# Patient Record
Sex: Female | Born: 1960 | Race: White | Hispanic: No | Marital: Married | Smoking: Never smoker
Health system: Southern US, Community
[De-identification: ages and names within clinical notes are randomized; demographics above are authoritative.]

## PROBLEM LIST (undated history)

## (undated) DIAGNOSIS — E039 Hypothyroidism, unspecified: Secondary | ICD-10-CM

## (undated) DIAGNOSIS — K8020 Calculus of gallbladder without cholecystitis without obstruction: Secondary | ICD-10-CM

## (undated) DIAGNOSIS — K760 Fatty (change of) liver, not elsewhere classified: Secondary | ICD-10-CM

## (undated) DIAGNOSIS — R112 Nausea with vomiting, unspecified: Secondary | ICD-10-CM

## (undated) DIAGNOSIS — K824 Cholesterolosis of gallbladder: Secondary | ICD-10-CM

## (undated) DIAGNOSIS — Z9889 Other specified postprocedural states: Secondary | ICD-10-CM

## (undated) DIAGNOSIS — E785 Hyperlipidemia, unspecified: Secondary | ICD-10-CM

## (undated) HISTORY — DX: Hyperlipidemia, unspecified: E78.5

---

## 1990-01-23 HISTORY — PX: DIAGNOSTIC LAPAROSCOPY: SUR761

## 2007-01-24 HISTORY — PX: ABDOMINAL HYSTERECTOMY: SHX81

## 2011-01-24 HISTORY — PX: COLONOSCOPY: SHX174

## 2017-07-11 ENCOUNTER — Other Ambulatory Visit: Payer: Self-pay | Admitting: Internal Medicine

## 2017-07-11 DIAGNOSIS — Z8719 Personal history of other diseases of the digestive system: Secondary | ICD-10-CM

## 2017-07-31 ENCOUNTER — Other Ambulatory Visit: Payer: Self-pay | Admitting: Gastroenterology

## 2017-07-31 ENCOUNTER — Ambulatory Visit (HOSPITAL_COMMUNITY)
Admission: RE | Admit: 2017-07-31 | Discharge: 2017-07-31 | Disposition: A | Discharge status: Home / Self Care | Payer: 59 | Source: Ambulatory Visit | Attending: Gastroenterology | Admitting: Gastroenterology

## 2017-07-31 DIAGNOSIS — R1011 Right upper quadrant pain: Secondary | ICD-10-CM | POA: Diagnosis not present

## 2017-08-03 ENCOUNTER — Other Ambulatory Visit: Payer: Self-pay | Admitting: Surgery

## 2017-08-13 ENCOUNTER — Encounter (HOSPITAL_COMMUNITY): Payer: Self-pay

## 2017-08-13 NOTE — Patient Instructions (Signed)
Your procedure is scheduled on: Thursday, August 16, 2017   Surgery Time:  9:00AM-10:00AM   [facility]    Report to admitting at 7:00 AM   Call this number if you have problems the morning of surgery [phone]   Do not eat food or drink liquids :After Midnight.   Do NOT smoke after Midnight   Complete one Ensure drink the morning of surgery by 6:00AM the day of surgery.   Take these medicines the morning of surgery with A SIP OF WATER: Thyroid                               You may not have any metal on your body including hair pins, jewelry, and body piercings             Do not wear make-up, lotions, powders, perfumes/cologne, or deodorant             Do not wear nail polish.  Do not shave  48 hours prior to surgery.            Do not bring valuables to the hospital. [facility] IS NOT             RESPONSIBLE   FOR VALUABLES.   Contacts, dentures or bridgework may not be worn into surgery.    Patients discharged the day of surgery will not be allowed to drive home.   Special Instructions: Bring a copy of your healthcare power of attorney and living will documents         the day of surgery if you haven't scanned them in before.              Please read over the following fact sheets you were given:  [facility] - Preparing for [facility] Before surgery, you can play an important role.  Because skin is not sterile, your skin needs to be as free of germs as possible.  You can reduce the number of germs on your skin by washing with CHG (chlorahexidine gluconate) soap before surgery.  CHG is an antiseptic cleaner which kills germs and bonds with the skin to continue killing germs even after washing. Please DO NOT use if you have an allergy to CHG or antibacterial soaps.  If your skin becomes reddened/irritated stop using the CHG and inform your nurse when you arrive at Short Stay. Do not shave (including legs and underarms) for at least 48 hours  prior to the first CHG shower.  You may shave your face/neck.  Please follow these instructions carefully:  1.  Shower with CHG Soap the night before surgery and the  morning of surgery.  2.  If you choose to wash your hair, wash your hair first as usual with your normal  shampoo.  3.  After you shampoo, rinse your hair and body thoroughly to remove the shampoo.                             4.  Use CHG as you would any other liquid soap.  You can apply chg directly to the skin and wash.  Gently with a scrungie or clean washcloth.  5.  Apply the CHG Soap to your body ONLY FROM THE NECK DOWN.   Do   not use on face/ open  Wound or open sores. Avoid contact with eyes, ears mouth and   genitals (private parts).                       Wash face,  Genitals (private parts) with your normal soap.             6.  Wash thoroughly, paying special attention to the area where your    surgery  will be performed.  7.  Thoroughly rinse your body with warm water from the neck down.  8.  DO NOT shower/wash with your normal soap after using and rinsing off the CHG Soap.                9.  Pat yourself dry with a clean towel.            10.  Wear clean pajamas.            11.  Place clean sheets on your bed the night of your first shower and do not  sleep with pets. Day of Surgery : Do not apply any lotions/deodorants the morning of surgery.  Please wear clean clothes to the hospital/surgery center.  FAILURE TO FOLLOW THESE INSTRUCTIONS MAY RESULT IN THE CANCELLATION OF YOUR SURGERY  PATIENT SIGNATURE_________________________________  NURSE SIGNATURE__________________________________  ________________________________________________________________________

## 2017-08-14 ENCOUNTER — Encounter (HOSPITAL_COMMUNITY)
Admission: RE | Admit: 2017-08-14 | Discharge: 2017-08-14 | Disposition: A | Discharge status: Home / Self Care | Payer: 59 | Source: Ambulatory Visit | Attending: Surgery | Admitting: Surgery

## 2017-08-14 ENCOUNTER — Other Ambulatory Visit: Payer: Self-pay

## 2017-08-14 ENCOUNTER — Encounter (HOSPITAL_COMMUNITY): Payer: Self-pay

## 2017-08-14 DIAGNOSIS — Z01812 Encounter for preprocedural laboratory examination: Secondary | ICD-10-CM | POA: Diagnosis not present

## 2017-08-14 DIAGNOSIS — K8010 Calculus of gallbladder with chronic cholecystitis without obstruction: Secondary | ICD-10-CM | POA: Diagnosis not present

## 2017-08-14 DIAGNOSIS — E039 Hypothyroidism, unspecified: Secondary | ICD-10-CM | POA: Diagnosis not present

## 2017-08-14 DIAGNOSIS — K8020 Calculus of gallbladder without cholecystitis without obstruction: Secondary | ICD-10-CM | POA: Diagnosis present

## 2017-08-14 DIAGNOSIS — Z79899 Other long term (current) drug therapy: Secondary | ICD-10-CM | POA: Diagnosis not present

## 2017-08-14 HISTORY — DX: Fatty (change of) liver, not elsewhere classified: K76.0

## 2017-08-14 HISTORY — DX: Hypothyroidism, unspecified: E03.9

## 2017-08-14 HISTORY — DX: Nausea with vomiting, unspecified: R11.2

## 2017-08-14 HISTORY — DX: Calculus of gallbladder without cholecystitis without obstruction: K80.20

## 2017-08-14 HISTORY — DX: Cholesterolosis of gallbladder: K82.4

## 2017-08-14 HISTORY — DX: Other specified postprocedural states: Z98.890

## 2017-08-14 LAB — CBC
HCT: 43.3 % (ref 36.0–46.0)
HEMOGLOBIN: 14.7 g/dL (ref 12.0–15.0)
MCH: 31.4 pg (ref 26.0–34.0)
MCHC: 33.9 g/dL (ref 30.0–36.0)
MCV: 92.5 fL (ref 78.0–100.0)
Platelets: 355 10*3/uL (ref 150–400)
RBC: 4.68 MIL/uL (ref 3.87–5.11)
RDW: 12.8 % (ref 11.5–15.5)
WBC: 8.1 10*3/uL (ref 4.0–10.5)

## 2017-08-15 NOTE — H&P (Signed)
   Gaspar GarbeRosemary E Henderson Documented: 08/03/2017 8:52 AM Location: [facility] Patient #: [id number] DOB: [birth year] Married / Language: English / Race: White Female   History of Present Illness (Pape Parson A. Magnus IvanBlackman MD; 08/03/2017 9:01 AM) The patient is a 57 year old female who presents for evaluation of gall stones. This patient was referred by Dr. Charna ElizabethJyothi Mann for evaluation of cholelithiasis. 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 She has been having some intermittent back pain but then this past Sunday developed epigastric and right upper quadrant abdominal pain with a burning sensation. There is no nausea or vomiting. She underwent an ultrasound showing cholelithiasis with gallbladder polyps. There was no evidence of cholecystitis but she reports she had a positive Murphy sign. She denies jaundice. She is otherwise without complaints.   Allergies Renee Ramus(Armen Ferguson, CMA; 08/03/2017 8:53 AM) Tegaderm *MEDICAL DEVICES AND SUPPLIES*  Tape  Medication History (Armen Ferguson, CMA; 08/03/2017 8:53 AM) Amphetamine-Dextroamphetamine (5MG  Tablet, Oral) Active. Amoxicillin-Pot Clavulanate (875-125MG  Tablet, Oral) Active. Armour Thyroid (30MG  Tablet, Oral) Active. Hydrocortisone Valerate (0.2% Cream, External) Active. Medications Reconciled  Vitals (Armen Ferguson CMA; 08/03/2017 8:52 AM) 08/03/2017 8:52 AM Weight: 149.5 lb Height: 63in Body Surface Area: 1.71 m Body Mass Index: 26.48 kg/m  Temp.: 98.78F  Pulse: 93 (Regular)  P.OX: 100% (Room air) BP: 124/82 (Sitting, Left Arm, Standard)       Physical Exam (Kairah Leoni A. Magnus IvanBlackman MD; 08/03/2017 9:01 AM) The physical exam findings are as follows: Note:She appears fairly comfortable today Lungs are clear bilaterally Cardiovascular is regular rate and rhythm Abdomen is soft. It is nondistended. There is mild tenderness with guarding in the right upper  quadrant    Assessment & Plan (Lisel Siegrist A. Magnus IvanBlackman MD; 08/03/2017 9:02 AM) SYMPTOMATIC CHOLELITHIASIS (K80.20) Impression: We discussed the diagnosis in detail. Again, she knows well about gallbladder surgery and wishes to proceed with a laparoscopic cholecystectomy as soon as possible. I discussed the procedure with her in detail. We discussed the risks of the procedures well. Surgery will be scheduled as soon as possible.

## 2017-08-16 ENCOUNTER — Other Ambulatory Visit: Payer: Self-pay

## 2017-08-16 ENCOUNTER — Ambulatory Visit (HOSPITAL_COMMUNITY)
Admission: RE | Admit: 2017-08-16 | Discharge: 2017-08-16 | Disposition: A | Discharge status: Home / Self Care | Payer: 59 | Source: Ambulatory Visit | Attending: Surgery | Admitting: Surgery

## 2017-08-16 ENCOUNTER — Encounter (HOSPITAL_COMMUNITY): Payer: Self-pay | Admitting: *Deleted

## 2017-08-16 ENCOUNTER — Encounter (HOSPITAL_COMMUNITY)
Admission: RE | Disposition: A | Discharge status: Home / Self Care | Payer: Self-pay | Source: Ambulatory Visit | Attending: Surgery

## 2017-08-16 ENCOUNTER — Ambulatory Visit (HOSPITAL_COMMUNITY): Payer: 59 | Admitting: Anesthesiology

## 2017-08-16 DIAGNOSIS — K8010 Calculus of gallbladder with chronic cholecystitis without obstruction: Secondary | ICD-10-CM | POA: Insufficient documentation

## 2017-08-16 DIAGNOSIS — Z79899 Other long term (current) drug therapy: Secondary | ICD-10-CM | POA: Insufficient documentation

## 2017-08-16 DIAGNOSIS — Z01812 Encounter for preprocedural laboratory examination: Secondary | ICD-10-CM | POA: Insufficient documentation

## 2017-08-16 DIAGNOSIS — E039 Hypothyroidism, unspecified: Secondary | ICD-10-CM | POA: Insufficient documentation

## 2017-08-16 HISTORY — PX: CHOLECYSTECTOMY: SHX55

## 2017-08-16 SURGERY — LAPAROSCOPIC CHOLECYSTECTOMY
Anesthesia: General | Site: Abdomen

## 2017-08-16 MED ORDER — KETOROLAC TROMETHAMINE 30 MG/ML IJ SOLN
INTRAMUSCULAR | Status: AC
  Filled 2017-08-16: qty 1

## 2017-08-16 MED ORDER — BUPIVACAINE HCL (PF) 0.5 % IJ SOLN
INTRAMUSCULAR | Status: DC | PRN
  Administered 2017-08-16: 20 mL

## 2017-08-16 MED ORDER — PROPOFOL 10 MG/ML IV BOLUS
INTRAVENOUS | Status: AC
  Filled 2017-08-16: qty 20

## 2017-08-16 MED ORDER — DEXAMETHASONE SODIUM PHOSPHATE 10 MG/ML IJ SOLN
INTRAMUSCULAR | Status: AC
  Filled 2017-08-16: qty 1

## 2017-08-16 MED ORDER — FENTANYL CITRATE (PF) 100 MCG/2ML IJ SOLN: 25.0000 ug | INTRAMUSCULAR | Status: DC | PRN

## 2017-08-16 MED ORDER — SCOPOLAMINE 1 MG/3DAYS TD PT72
MEDICATED_PATCH | TRANSDERMAL | Status: DC | PRN
  Administered 2017-08-16: 1 via TRANSDERMAL

## 2017-08-16 MED ORDER — GABAPENTIN 300 MG PO CAPS: 300.0000 mg | ORAL_CAPSULE | ORAL | Status: DC

## 2017-08-16 MED ORDER — ONDANSETRON HCL 4 MG/2ML IJ SOLN: 4.0000 mg | Freq: Four times a day (QID) | INTRAMUSCULAR | Status: DC | PRN

## 2017-08-16 MED ORDER — SCOPOLAMINE 1 MG/3DAYS TD PT72
MEDICATED_PATCH | TRANSDERMAL | Status: AC
  Filled 2017-08-16: qty 1

## 2017-08-16 MED ORDER — SUGAMMADEX SODIUM 200 MG/2ML IV SOLN
INTRAVENOUS | Status: AC
  Filled 2017-08-16: qty 2

## 2017-08-16 MED ORDER — BUPIVACAINE HCL (PF) 0.5 % IJ SOLN
INTRAMUSCULAR | Status: AC
  Filled 2017-08-16: qty 30

## 2017-08-16 MED ORDER — CEFAZOLIN SODIUM-DEXTROSE 2-4 GM/100ML-% IV SOLN
2.0000 g | INTRAVENOUS | Status: AC
  Administered 2017-08-16: 2 g via INTRAVENOUS
  Filled 2017-08-16: qty 100

## 2017-08-16 MED ORDER — ACETAMINOPHEN 10 MG/ML IV SOLN
INTRAVENOUS | Status: DC | PRN
  Administered 2017-08-16: 1000 mg via INTRAVENOUS

## 2017-08-16 MED ORDER — 0.9 % SODIUM CHLORIDE (POUR BTL) OPTIME
TOPICAL | Status: DC | PRN
  Administered 2017-08-16: 1000 mL

## 2017-08-16 MED ORDER — LIDOCAINE 2% (20 MG/ML) 5 ML SYRINGE
INTRAMUSCULAR | Status: DC | PRN
  Administered 2017-08-16: 100 mg via INTRAVENOUS

## 2017-08-16 MED ORDER — PROPOFOL 10 MG/ML IV BOLUS
INTRAVENOUS | Status: DC | PRN
  Administered 2017-08-16: 160 mg via INTRAVENOUS

## 2017-08-16 MED ORDER — LACTATED RINGERS IV SOLN
INTRAVENOUS | Status: DC
  Administered 2017-08-16: 08:00:00 via INTRAVENOUS

## 2017-08-16 MED ORDER — ROCURONIUM BROMIDE 10 MG/ML (PF) SYRINGE
PREFILLED_SYRINGE | INTRAVENOUS | Status: AC
  Filled 2017-08-16: qty 10

## 2017-08-16 MED ORDER — KETOROLAC TROMETHAMINE 30 MG/ML IJ SOLN
INTRAMUSCULAR | Status: DC | PRN
  Administered 2017-08-16: 30 mg via INTRAVENOUS

## 2017-08-16 MED ORDER — ONDANSETRON HCL 4 MG/2ML IJ SOLN
INTRAMUSCULAR | Status: AC
  Filled 2017-08-16: qty 2

## 2017-08-16 MED ORDER — DEXAMETHASONE SODIUM PHOSPHATE 10 MG/ML IJ SOLN
INTRAMUSCULAR | Status: DC | PRN
  Administered 2017-08-16: 10 mg via INTRAVENOUS

## 2017-08-16 MED ORDER — FENTANYL CITRATE (PF) 250 MCG/5ML IJ SOLN
INTRAMUSCULAR | Status: AC
  Filled 2017-08-16: qty 5

## 2017-08-16 MED ORDER — OXYCODONE HCL 5 MG PO TABS: 5.0000 mg | ORAL_TABLET | Freq: Four times a day (QID) | ORAL | 0 refills | Status: DC | PRN

## 2017-08-16 MED ORDER — MIDAZOLAM HCL 2 MG/2ML IJ SOLN
INTRAMUSCULAR | Status: AC
  Filled 2017-08-16: qty 2

## 2017-08-16 MED ORDER — CELECOXIB 200 MG PO CAPS: 200.0000 mg | ORAL_CAPSULE | ORAL | Status: DC

## 2017-08-16 MED ORDER — FENTANYL CITRATE (PF) 100 MCG/2ML IJ SOLN
INTRAMUSCULAR | Status: DC | PRN
  Administered 2017-08-16: 100 ug via INTRAVENOUS
  Administered 2017-08-16 (×2): 50 ug via INTRAVENOUS

## 2017-08-16 MED ORDER — OXYCODONE HCL 5 MG PO TABS: 5.0000 mg | ORAL_TABLET | Freq: Once | ORAL | Status: DC | PRN

## 2017-08-16 MED ORDER — LACTATED RINGERS IR SOLN
Status: DC | PRN
  Administered 2017-08-16: 1000 mL

## 2017-08-16 MED ORDER — OXYCODONE HCL 5 MG/5ML PO SOLN: 5.0000 mg | Freq: Once | ORAL | Status: DC | PRN

## 2017-08-16 MED ORDER — ACETAMINOPHEN 10 MG/ML IV SOLN
INTRAVENOUS | Status: AC
  Filled 2017-08-16: qty 100

## 2017-08-16 MED ORDER — CHLORHEXIDINE GLUCONATE CLOTH 2 % EX PADS: 6.0000 | MEDICATED_PAD | Freq: Once | CUTANEOUS | Status: DC

## 2017-08-16 MED ORDER — LIDOCAINE 2% (20 MG/ML) 5 ML SYRINGE
INTRAMUSCULAR | Status: AC
  Filled 2017-08-16: qty 5

## 2017-08-16 MED ORDER — ONDANSETRON HCL 4 MG/2ML IJ SOLN
INTRAMUSCULAR | Status: DC | PRN
  Administered 2017-08-16: 4 mg via INTRAVENOUS

## 2017-08-16 MED ORDER — SUGAMMADEX SODIUM 200 MG/2ML IV SOLN
INTRAVENOUS | Status: DC | PRN
  Administered 2017-08-16: 200 mg via INTRAVENOUS

## 2017-08-16 MED ORDER — MIDAZOLAM HCL 5 MG/5ML IJ SOLN
INTRAMUSCULAR | Status: DC | PRN
  Administered 2017-08-16: 2 mg via INTRAVENOUS

## 2017-08-16 MED ORDER — ROCURONIUM BROMIDE 10 MG/ML (PF) SYRINGE
PREFILLED_SYRINGE | INTRAVENOUS | Status: DC | PRN
  Administered 2017-08-16: 40 mg via INTRAVENOUS

## 2017-08-16 MED ORDER — ACETAMINOPHEN 500 MG PO TABS: 1000.0000 mg | ORAL_TABLET | ORAL | Status: DC

## 2017-08-16 SURGICAL SUPPLY — 26 items
APPLIER CLIP 5 13 M/L LIGAMAX5 (MISCELLANEOUS) ×3
CABLE HIGH FREQUENCY MONO STRZ (ELECTRODE) ×3 IMPLANT
CHLORAPREP W/TINT 26ML (MISCELLANEOUS) ×3 IMPLANT
CLIP APPLIE 5 13 M/L LIGAMAX5 (MISCELLANEOUS) ×1 IMPLANT
COVER MAYO STAND STRL (DRAPES) IMPLANT
DECANTER SPIKE VIAL GLASS SM (MISCELLANEOUS) ×3 IMPLANT
DERMABOND ADVANCED (GAUZE/BANDAGES/DRESSINGS) ×2
DERMABOND ADVANCED .7 DNX12 (GAUZE/BANDAGES/DRESSINGS) ×1 IMPLANT
DRAPE C-ARM 42X120 X-RAY (DRAPES) IMPLANT
ELECT REM PT RETURN 15FT ADLT (MISCELLANEOUS) ×3 IMPLANT
GLOVE SURG SIGNA 7.5 PF LTX (GLOVE) ×3 IMPLANT
GOWN STRL REUS W/TWL XL LVL3 (GOWN DISPOSABLE) ×6 IMPLANT
HEMOSTAT SURGICEL 4X8 (HEMOSTASIS) IMPLANT
KIT BASIN OR (CUSTOM PROCEDURE TRAY) ×3 IMPLANT
POUCH SPECIMEN RETRIEVAL 10MM (ENDOMECHANICALS) ×3 IMPLANT
SCISSORS LAP 5X35 DISP (ENDOMECHANICALS) ×3 IMPLANT
SET CHOLANGIOGRAPH MIX (MISCELLANEOUS) IMPLANT
SET IRRIG TUBING LAPAROSCOPIC (IRRIGATION / IRRIGATOR) ×3 IMPLANT
SLEEVE XCEL OPT CAN 5 100 (ENDOMECHANICALS) ×6 IMPLANT
SUT MNCRL AB 4-0 PS2 18 (SUTURE) ×3 IMPLANT
TOWEL OR 17X26 10 PK STRL BLUE (TOWEL DISPOSABLE) ×3 IMPLANT
TOWEL OR NON WOVEN STRL DISP B (DISPOSABLE) ×3 IMPLANT
TRAY LAPAROSCOPIC (CUSTOM PROCEDURE TRAY) ×3 IMPLANT
TROCAR BLADELESS OPT 5 100 (ENDOMECHANICALS) ×3 IMPLANT
TROCAR XCEL BLUNT TIP 100MML (ENDOMECHANICALS) ×3 IMPLANT
TUBING INSUF HEATED (TUBING) ×3 IMPLANT

## 2017-08-16 NOTE — Anesthesia Preprocedure Evaluation (Signed)
Anesthesia Evaluation  Patient identified by MRN, date of birth, ID band Patient awake    Reviewed: Allergy & Precautions, H&P , NPO status , Patient's Chart, lab work & pertinent test results  History of Anesthesia Complications (+) PONV and history of anesthetic complications  Airway Mallampati: II   Neck ROM: full    Dental   Pulmonary neg pulmonary ROS,    breath sounds clear to auscultation       Cardiovascular negative cardio ROS   Rhythm:regular Rate:Normal     Neuro/Psych    GI/Hepatic Symptomatic gallstones   Endo/Other  Hypothyroidism   Renal/GU      Musculoskeletal   Abdominal   Peds  Hematology   Anesthesia Other Findings   Reproductive/Obstetrics                             Anesthesia Physical Anesthesia Plan  ASA: II  Anesthesia Plan: General   Post-op Pain Management:    Induction: Intravenous  PONV Risk Score and Plan: 4 or greater and Ondansetron, Dexamethasone, Midazolam, Scopolamine patch - Pre-op and Treatment may vary due to age or medical condition  Airway Management Planned: Oral ETT  Additional Equipment:   Intra-op Plan:   Post-operative Plan: Extubation in OR  Informed Consent: I have reviewed the patients History and Physical, chart, labs and discussed the procedure including the risks, benefits and alternatives for the proposed anesthesia with the patient or authorized representative who has indicated his/her understanding and acceptance.     Plan Discussed with: CRNA, Anesthesiologist and Surgeon  Anesthesia Plan Comments:         Anesthesia Quick Evaluation

## 2017-08-16 NOTE — Interval H&P Note (Signed)
History and Physical Interval Note: no change in H and P  08/16/2017 8:52 AM  Gail Henderson  has presented today for surgery, with the diagnosis of SYMPTOMATIC CHOLELITHIASIS  The various methods of treatment have been discussed with the patient and family. After consideration of risks, benefits and other options for treatment, the patient has consented to  Procedure(s): LAPAROSCOPIC CHOLECYSTECTOMY (N/A) as a surgical intervention .  The patient's history has been reviewed, patient examined, no change in status, stable for surgery.  I have reviewed the patient's chart and labs.  Questions were answered to the patient's satisfaction.     Miriam Kestler A

## 2017-08-16 NOTE — Transfer of Care (Signed)
Immediate Anesthesia Transfer of Care Note  Patient: Gail SpurrRosemary E Henderson  Procedure(s) Performed: LAPAROSCOPIC CHOLECYSTECTOMY (N/A Abdomen)  Patient Location: PACU  Anesthesia Type:General  Level of Consciousness: awake, alert  and oriented  Airway & Oxygen Therapy: Patient Spontanous Breathing and Patient connected to face mask oxygen  Post-op Assessment: Report given to RN and Post -op Vital signs reviewed and stable  Post vital signs: Reviewed and stable  Last Vitals:  Vitals Value Taken Time  BP    Temp    Pulse 54 08/16/2017 10:27 AM  Resp 12 08/16/2017 10:27 AM  SpO2 98 % 08/16/2017 10:27 AM  Vitals shown include unvalidated device data.  Last Pain:  Vitals:   08/16/17 0720  TempSrc: Oral         Complications: No apparent anesthesia complications

## 2017-08-16 NOTE — Op Note (Signed)
Laparoscopic Cholecystectomy Procedure Note  Indications: This patient presents with symptomatic gallbladder disease and will undergo laparoscopic cholecystectomy.  Pre-operative Diagnosis: symptomatic cholelithiasis  Post-operative Diagnosis: Same  Surgeon: Abigail Miyamoto A   Assistants: 0  Anesthesia: General endotracheal anesthesia  ASA Class: 2  Procedure Details  The patient was seen again in the Holding Room. The risks, benefits, complications, treatment options, and expected outcomes were discussed with the patient. The possibilities of reaction to medication, pulmonary aspiration, perforation of viscus, bleeding, recurrent infection, finding a normal gallbladder, the need for additional procedures, failure to diagnose a condition, the possible need to convert to an open procedure, and creating a complication requiring transfusion or operation were discussed with the patient. The likelihood of improving the patient's symptoms with return to their baseline status is good.  The patient and/or family concurred with the proposed plan, giving informed consent. The site of surgery properly noted. The patient was taken to Operating Room, identified as Gail Henderson and the procedure verified as Laparoscopic Cholecystectomy with Intraoperative Cholangiogram. A Time Out was held and the above information confirmed.  Prior to the induction of general anesthesia, antibiotic prophylaxis was administered. General endotracheal anesthesia was then administered and tolerated well. After the induction, the abdomen was prepped with Chloraprep and draped in sterile fashion. The patient was positioned in the supine position.  Local anesthetic agent was injected into the skin near the umbilicus and an incision made. We dissected down to the abdominal fascia with blunt dissection.  The fascia was incised vertically and we entered the peritoneal cavity bluntly.  A pursestring suture of 0-Vicryl was placed  around the fascial opening.  The Hasson cannula was inserted and secured with the stay suture.  Pneumoperitoneum was then created with CO2 and tolerated well without any adverse changes in the patient's vital signs. A 5-mm port was placed in the subxiphoid position.  Two 5-mm ports were placed in the right upper quadrant. All skin incisions were infiltrated with a local anesthetic agent before making the incision and placing the trocars.   We positioned the patient in reverse Trendelenburg, tilted slightly to the patient's left.  I did look at her RLQ and appendix which was normal.  The gallbladder was identified, the fundus grasped and retracted cephalad. The gall bladder was chronically thick walled in appearance. Adhesions were lysed bluntly and with the electrocautery where indicated, taking care not to injure any adjacent organs or viscus. The infundibulum was grasped and retracted laterally, exposing the peritoneum overlying the triangle of Calot. This was then divided and exposed in a blunt fashion. The cystic duct was clearly identified and bluntly dissected circumferentially. A critical view of the cystic duct and cystic artery was obtained.  The cystic duct was then ligated with clips and divided. The cystic artery was, dissected free, ligated with clips and divided as well.   The gallbladder was dissected from the liver bed in retrograde fashion with the electrocautery. The gallbladder was removed and placed in an Endocatch sac. The liver bed was irrigated and inspected. Hemostasis was achieved with the electrocautery. Copious irrigation was utilized and was repeatedly aspirated until clear.  The gallbladder and Endocatch sac were then removed through the umbilical port site.  The pursestring suture was used to close the umbilical fascia.    We again inspected the right upper quadrant for hemostasis.  Pneumoperitoneum was released as we removed the trocars.  4-0 Monocryl was used to close the skin.    Skin glue was then  applied. The patient was then extubated and brought to the recovery room in stable condition. Instrument, sponge, and needle counts were correct at closure and at the conclusion of the case.   Findings: Chronic cholecystitis with Cholelithiasis  Estimated Blood Loss: Minimal         Drains: 0         Specimens: Gallbladder           Complications: None; patient tolerated the procedure well.         Disposition: PACU - hemodynamically stable.         Condition: stable

## 2017-08-16 NOTE — Anesthesia Procedure Notes (Signed)
Procedure Name: Intubation Date/Time: 08/16/2017 9:35 AM Performed by: Mujahid Jalomo D, CRNA Pre-anesthesia Checklist: Patient identified, Emergency Drugs available, Suction available and Patient being monitored Patient Re-evaluated:Patient Re-evaluated prior to induction Oxygen Delivery Method: Circle system utilized Preoxygenation: Pre-oxygenation with 100% oxygen Induction Type: IV induction Ventilation: Mask ventilation without difficulty Laryngoscope Size: Mac and 3 Grade View: Grade I Tube type: Oral Tube size: 7.0 mm Number of attempts: 1 Airway Equipment and Method: Stylet Placement Confirmation: ETT inserted through vocal cords under direct vision,  positive ETCO2 and breath sounds checked- equal and bilateral Secured at: 21 cm Tube secured with: Tape Dental Injury: Teeth and Oropharynx as per pre-operative assessment

## 2017-08-16 NOTE — Discharge Instructions (Signed)
[facility] ______[facility] LAPAROSCOPIC SURGERY: POST OP INSTRUCTIONS Always review your discharge instruction sheet given to you by the facility where your surgery was performed. IF YOU HAVE DISABILITY OR FAMILY LEAVE FORMS, YOU MUST BRING THEM TO THE OFFICE FOR PROCESSING.   DO NOT GIVE THEM TO YOUR DOCTOR.  1. A prescription for pain medication may be given to you upon discharge.  Take your pain medication as prescribed, if needed.  If narcotic pain medicine is not needed, then you may take acetaminophen (Tylenol) or ibuprofen (Advil) as needed. 2. Take your usually prescribed medications unless otherwise directed. 3. If you need a refill on your pain medication, please contact your pharmacy.  They will contact our office to request authorization. Prescriptions will not be filled after 5pm or on week-ends. 4. You should follow a light diet the first few days after arrival home, such as soup and crackers, etc.  Be sure to include lots of fluids daily. 5. Most patients will experience some swelling and bruising in the area of the incisions.  Ice packs will help.  Swelling and bruising can take several days to resolve.  6. It is common to experience some constipation if taking pain medication after surgery.  Increasing fluid intake and taking a stool softener (such as Colace) will usually help or prevent this problem from occurring.  A mild laxative (Milk of Magnesia or Miralax) should be taken according to package instructions if there are no bowel movements after 48 hours. 7. Unless discharge instructions indicate otherwise, you may remove your bandages 24-48 hours after surgery, and you may shower at that time.  You may have steri-strips (small skin tapes) in place directly over the incision.  These strips should be left on the skin for 7-10 days.  If your surgeon used skin glue on the incision, you may shower in 24 hours.  The glue will flake off over the next 2-3 weeks.  Any sutures or  staples will be removed at the office during your follow-up visit. 8. ACTIVITIES:  You may resume regular (light) daily activities beginning the next day--such as daily self-care, walking, climbing stairs--gradually increasing activities as tolerated.  You may have sexual intercourse when it is comfortable.  Refrain from any heavy lifting or straining until approved by your doctor. a. You may drive when you are no longer taking prescription pain medication, you can comfortably wear a seatbelt, and you can safely maneuver your car and apply brakes. b. RETURN TO WORK:  __________________________________________________________ 9. You should see your doctor in the office for a follow-up appointment approximately 2-3 weeks after your surgery.  Make sure that you call for this appointment within a day or two after you arrive home to insure a convenient appointment time. 10. OTHER INSTRUCTIONS:OK TO SHOWER STARTING TOMORROW 11. ICE PACK, TYLENOL, IBUPROFEN ALSO FOR PAIN 12. NO LIFTING MORE THAN 15 TO 20 LBS FOR 2 WEEKS __________________________________________________________________________________________________________________________ __________________________________________________________________________________________________________________________ WHEN TO CALL YOUR DOCTOR: 1. Fever over 101.0 2. Inability to urinate 3. Continued bleeding from incision. 4. Increased pain, redness, or drainage from the incision. 5. Increasing abdominal pain  The clinic staff is available to answer your questions during regular business hours.  Please dont hesitate to call and ask to speak to one of the nurses for clinical concerns.  If you have a medical emergency, go to the nearest emergency room or call 911.  A surgeon from [facility] is always on call at the hospital. [address], [address], [place], [place]  [address] ? [address] [address], [place], [place]   [address] °[phone] ?  [phone] ? FAX [phone] °Web site: [email or web address] °

## 2017-08-17 ENCOUNTER — Encounter (HOSPITAL_COMMUNITY): Payer: Self-pay | Admitting: Surgery

## 2017-08-17 NOTE — Anesthesia Postprocedure Evaluation (Signed)
Anesthesia Post Note  Patient: Verne SpurrRosemary E Hennington  Procedure(s) Performed: LAPAROSCOPIC CHOLECYSTECTOMY (N/A Abdomen)     Patient location during evaluation: PACU Anesthesia Type: General Level of consciousness: awake and alert Pain management: pain level controlled Vital Signs Assessment: post-procedure vital signs reviewed and stable Respiratory status: spontaneous breathing, nonlabored ventilation, respiratory function stable and patient connected to nasal cannula oxygen Cardiovascular status: blood pressure returned to baseline and stable Postop Assessment: no apparent nausea or vomiting Anesthetic complications: no    Last Vitals:  Vitals:   08/16/17 1130 08/16/17 1205  BP: 103/65 99/69  Pulse: (!) 50 (!) 50  Resp: 18 16  Temp: 36.4 C   SpO2: 96% 100%    Last Pain:  Vitals:   08/16/17 1130  TempSrc:   PainSc: 0-No pain                 Nyasiah Moffet S

## 2020-02-17 ENCOUNTER — Encounter: Payer: Self-pay | Admitting: Cardiology

## 2020-02-17 ENCOUNTER — Ambulatory Visit: Payer: 59 | Admitting: Cardiology

## 2020-02-17 ENCOUNTER — Other Ambulatory Visit: Payer: Self-pay

## 2020-02-17 VITALS — BP 122/90 | HR 65 | Temp 97.6°F | Ht 63.0 in | Wt 157.0 lb

## 2020-02-17 DIAGNOSIS — R030 Elevated blood-pressure reading, without diagnosis of hypertension: Secondary | ICD-10-CM

## 2020-02-17 DIAGNOSIS — E785 Hyperlipidemia, unspecified: Secondary | ICD-10-CM

## 2020-02-17 DIAGNOSIS — I208 Other forms of angina pectoris: Secondary | ICD-10-CM

## 2020-02-17 DIAGNOSIS — R9431 Abnormal electrocardiogram [ECG] [EKG]: Secondary | ICD-10-CM

## 2020-02-17 MED ORDER — METOPROLOL TARTRATE 50 MG PO TABS: 50.0000 mg | ORAL_TABLET | Freq: Two times a day (BID) | ORAL | 2 refills | Status: DC

## 2020-02-17 MED ORDER — ASPIRIN 81 MG PO CHEW: 81.0000 mg | CHEWABLE_TABLET | Freq: Every day | ORAL | Status: DC

## 2020-02-17 MED ORDER — NITROGLYCERIN 0.4 MG SL SUBL: 0.4000 mg | SUBLINGUAL_TABLET | SUBLINGUAL | 3 refills | Status: DC | PRN

## 2020-02-17 NOTE — Progress Notes (Signed)
Primary Physician/Referring:  Audley Hose, MD  Patient ID: Gail Henderson, female    DOB: 04-24-1960, 60 y.o.   MRN: [id number]  Chief Complaint  Patient presents with  . Abnormal ECG  . Medication Reaction  . New Patient (Initial Visit)  . Chest Pain   HPI:    Gail Henderson  is a 60 y.o. Caucasian female with no significant prior cardiovascular history, no premature coronary artery disease in family for CAD, non-smoker and nondiabetic referred on urgent basis by Dr. 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  Patient symptoms started 2 weeks ago while she was walking her dog and had palpitations and chest discomfort in the form of tightness.  Episode resolved after she had rested when returned home.  Since then she has noticed anytime she is anxious or stressed or with exertion she is having occasional episodes of chest tightness.  No other associated symptoms.  Past Medical History:  Diagnosis Date  . Fatty liver    showed up on the gallbladder ultrasound; liver enzymes normal  . Gallbladder polyp   . Gallstone   . Hypothyroidism   . PONV (postoperative nausea and vomiting)    Past Surgical History:  Procedure Laterality Date  . ABDOMINAL HYSTERECTOMY  2009   BSO  . CESAREAN SECTION  1999  . CHOLECYSTECTOMY N/A 08/16/2017   Procedure: LAPAROSCOPIC CHOLECYSTECTOMY;  Surgeon: Coralie Keens, MD;  Location: [facility];  Service: General;  Laterality: N/A;  . COLONOSCOPY  2013  . DIAGNOSTIC LAPAROSCOPY  1992   ovarian cyst right   Family History  Problem Relation Age of Onset  . Heart attack Mother   . Hypertension Sister   . Diabetes Brother     Social History   Tobacco Use  . Smoking status: Never Smoker  . Smokeless tobacco: Never Used  Substance Use Topics  . Alcohol use: Yes    Alcohol/week: 1.0  standard drink    Types: 1 Glasses of wine per week   Marital Status: Married  ROS  Review of Systems  Cardiovascular: Positive for chest pain and palpitations. Negative for dyspnea on exertion and leg swelling.  Gastrointestinal: Negative for melena.   Objective  Blood pressure 122/90, pulse 65, temperature 97.6 F (36.4 C), height 5' 3"  (1.6 m), weight 157 lb (71.2 kg), SpO2 99 %.  Vitals with BMI 02/17/2020 02/17/2020 08/16/2017  Height - 5' 3"  -  Weight - 157 lbs -  BMI - 35.46 -  Systolic 568 127 99  Diastolic 90 90 69  Pulse - 65 50     Physical Exam Cardiovascular:     Rate and Rhythm: Normal rate and regular rhythm.     Pulses: Intact distal pulses.     Heart sounds: Normal heart sounds. No murmur heard. No gallop.      Comments: No leg edema, no JVD. Pulmonary:     Effort: Pulmonary effort is normal.     Breath sounds: Normal breath sounds.  Abdominal:     General: Bowel sounds are normal.     Palpations: Abdomen is soft.    Laboratory examination:   No results for input(s): NA, K, CL, CO2, GLUCOSE, BUN, CREATININE, CALCIUM, GFRNONAA, GFRAA in the last 8760 hours. CrCl cannot be calculated (No successful lab value found.).  No flowsheet data found. CBC Latest Ref  Rng & Units 08/14/2017  WBC 4.0 - 10.5 K/uL 8.1  Hemoglobin 12.0 - 15.0 g/dL 14.7  Hematocrit 36.0 - 46.0 % 43.3  Platelets 150 - 400 K/uL 355    Lipid Panel No results for input(s): CHOL, TRIG, LDLCALC, VLDL, HDL, CHOLHDL, LDLDIRECT in the last 8760 hours.  HEMOGLOBIN A1C No results found for: HGBA1C, MPG TSH No results for input(s): TSH in the last 8760 hours.  External labs:   Labs 08/01/2018:  Hb 14.5/HCT 43.3, platelets 379.  Serum glucose 1 3 mg, BUN 13, creatinine 0.83, EGFR >78 mL.  Potassium 4.4.  CMP otherwise normal.  Total cholesterol 219, triglycerides 134, HDL 67, LDL 125.  A1c 5.4%.  TSH normal at 2.220.  C-reactive protein 0.82.  Medications and allergies   Allergies   Allergen Reactions  . Milk Protein     eczema  . Wheat Bran     Migraine headaches  . Other Rash    TEGADERM FILM--rash/irritated skin     Outpatient Medications Prior to Visit  Medication Sig Dispense Refill  . amphetamine-dextroamphetamine (ADDERALL) 5 MG tablet Take 1 tablet by mouth daily.  0  . Cholecalciferol (VITAMIN D3) 2000 units TABS Take 2,000 Units by mouth daily.    . hydrocortisone valerate cream (WESTCORT) 0.2 % Apply 1 application topically 2 (two) times daily as needed. For suppurativa dermatitis  0  . Misc Natural Products (PROGESTERONE EX) Apply 1 application topically 2 (two) times daily. Progesterone Cream 5% Compounded by [employer]    . NONFORMULARY OR COMPOUNDED ITEM Apply 1 application topically every evening. BIEST CREAM 70-30 Compounded by [employer]    . pyridoxine (B-6) 100 MG tablet Take 100 mg by mouth daily.    . TESTOSTERONE NA Apply 1 application topically daily. Testosterone Topical Cream 2% Compounded by [employer]    . thyroid (ARMOUR) 30 MG tablet Take 30 mg by mouth daily before breakfast.    . vitamin B-12 (CYANOCOBALAMIN) 500 MCG tablet Take 500 mcg by mouth daily.    Marland Kitchen isometheptene-acetaminophen-dichloralphenazone (MIDRIN) 65-100-325 MG capsule Take 1 capsule by mouth 4 (four) times daily as needed for migraine. Maximum 5 capsules in 12 hours for migraine headaches, 8 capsules in 24 hours for tension headaches. (Patient not taking: Reported on 02/17/2020)    . magic mouthwash SOLN Take 5 mLs by mouth 4 (four) times daily as needed (for lichen planus).    Marland Kitchen OVER THE COUNTER MEDICATION 5,000 mcg every morning.    Marland Kitchen OVER THE COUNTER MEDICATION 5,000 mcg every morning.    Marland Kitchen oxyCODONE (OXY IR/ROXICODONE) 5 MG immediate release tablet Take 1 tablet (5 mg total) by mouth every 6 (six) hours as needed for moderate pain or severe pain. 20 tablet 0  . UNABLE TO FIND 50 mg every morning. Med Name:     No facility-administered  medications prior to visit.    Radiology:   No results found.  Cardiac Studies:    EKG:       EKG 02/17/2020: Normal sinus rhythm at rate of 61 bpm, normal axis, no evidence of ischemia, normal EKG.     Assessment     ICD-10-CM   1. Stable angina pectoris (HCC)  I20.8 CT CORONARY MORPH W/CTA COR W/SCORE W/CA W/CM &/OR WO/CM    CT CORONARY FRACTIONAL FLOW RESERVE DATA PREP    CT CORONARY FRACTIONAL FLOW RESERVE FLUID ANALYSIS    nitroGLYCERIN (NITROSTAT) 0.4 MG SL tablet    aspirin (ASPIRIN CHILDRENS) 81 MG chewable  tablet    PCV ECHOCARDIOGRAM COMPLETE  2. Nonspecific abnormal electrocardiogram (ECG) (EKG)  R94.31 EKG 12-Lead    CT CORONARY MORPH W/CTA COR W/SCORE W/CA W/CM &/OR WO/CM    CT CORONARY FRACTIONAL FLOW RESERVE DATA PREP    CT CORONARY FRACTIONAL FLOW RESERVE FLUID ANALYSIS    PCV ECHOCARDIOGRAM COMPLETE  3. Mild hyperlipidemia  E78.5 LDL cholesterol, direct    Lipid Panel With LDL/HDL Ratio  4. Elevated BP without diagnosis of hypertension  R03.0 TSH+T4F+T3Free    CMP14+EGFR      Meds ordered this encounter  Medications  . metoprolol tartrate (LOPRESSOR) 50 MG tablet    Sig: Take 1 tablet (50 mg total) by mouth 2 (two) times daily.    Dispense:  60 tablet    Refill:  2  . nitroGLYCERIN (NITROSTAT) 0.4 MG SL tablet    Sig: Place 1 tablet (0.4 mg total) under the tongue every 5 (five) minutes as needed for up to 25 days for chest pain.    Dispense:  25 tablet    Refill:  3  . aspirin (ASPIRIN CHILDRENS) 81 MG chewable tablet    Sig: Chew 1 tablet (81 mg total) by mouth daily.   Orders Placed This Encounter  Procedures  . CT CORONARY MORPH W/CTA COR W/SCORE W/CA W/CM &/OR WO/CM    FFR Data Prep and Fluid analysis orders will be ordered for pre-authorization.    Standing Status:   Future    Standing Expiration Date:   05/17/2020    Order Specific Question:   If indicated for the ordered procedure, I authorize the administration of contrast media per  Radiology protocol    Answer:   Yes    Order Specific Question:   Preferred imaging location?    Answer:   [facility]    Order Specific Question:   Is patient pregnant?    Answer:   No    Order Specific Question:   Radiology Contrast Protocol - do NOT remove file path    Answer:   \\[email or web address]    Order Specific Question:   Release to patient    Answer:   Immediate  . CT CORONARY FRACTIONAL FLOW RESERVE DATA PREP    FFR Data Prep and Fluid analysis orders will be ordered for pre-authorization.    Standing Status:   Future    Standing Expiration Date:   05/17/2020    Order Specific Question:   Is patient pregnant?    Answer:   No    Order Specific Question:   Preferred imaging location?    Answer:   [facility]    Order Specific Question:   Radiology Contrast Protocol - do NOT remove file path    Answer:   \\[email or web address]  . CT CORONARY FRACTIONAL FLOW RESERVE FLUID ANALYSIS    FFR Data Prep and Fluid analysis orders will be ordered for pre-authorization.    Standing Status:   Future    Standing Expiration Date:   05/17/2020    Order Specific Question:   Is patient pregnant?    Answer:   No    Order Specific Question:   Preferred imaging location?    Answer:   [facility]    Order Specific Question:   Radiology Contrast Protocol - do NOT remove file path    Answer:   \\[email or web address]    Order Specific Question:   Release to patient    Answer:   Immediate  . LDL cholesterol, direct  .  Lipid Panel With LDL/HDL Ratio  . TSH+T4F+T3Free  . CMP14+EGFR  . EKG 12-Lead  . PCV ECHOCARDIOGRAM COMPLETE    Standing Status:   Future    Standing Expiration Date:   02/16/2021    Recommendations:   Gail Henderson is a 60 y.o. Caucasian female with no significant prior cardiovascular history, no premature coronary artery disease in family for CAD, non-smoker and nondiabetic  referred on urgent basis by Dr. 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  Symptoms clearly are brought on by exertion and also by mental stress which are indicative of angina pectoris.  I reviewed her external labs she also has mild hyperlipidemia and hyperglycemia as well.  I have started her on aspirin 81 mg daily, metoprolol tartrate 50 mg twice daily, her blood pressure was elevated today, probably related to stress.  We had a long discussion regarding nuclear stress testing versus coronary CT angiogram.  Patient prefers to have coronary CTA, keeping her heart rate low was discussed with the patient, she is a Immunologist and understands angina pectoris well.  I have also given her a prescription for sublingual nitroglycerin and advised her to not to do any heavy exertional activity.  If she has any pain that is not easily relieved with rest or is continuous, she needs to go to the emergency room.  I will repeat labs including CMP, lipids and she is presently on thyroid supplement due to marked fatigue after she underwent hysterectomy and she is also on HRT, will obtain thyroid panel.  I will keep PCP informed.  I will see her back after the coronary CTA, tentatively set up office visit in 4 weeks.  Will schedule for an echocardiogram.  Patient is not vaccinated against COVID-19.  I have discussed with her regarding high numbers of COVID-19 positive patients in the hospital were mostly not vaccinated and encouraged her to do research on this aspect and encouraged her to get vaccinated.    Adrian Prows, MD, [facility] 02/17/2020, 1:42 PM Office: [phone]  CC: Carol Ada, MD (GI); Crissie Sickles, MD (PCP)

## 2020-02-17 NOTE — Patient Instructions (Addendum)
Your cardiac CT will be scheduled at one of the below locations:   [facility]  [address]  [place], [place] [address]  [phone]   Please arrive at the [facility] main entrance of [facility] 30-45 minutes prior to test start time.  Proceed to the [facility] Radiology Department (first floor) to check-in and test prep.   Please follow these instructions carefully (unless otherwise directed):   Hold all erectile dysfunction medications at least 48 hours prior to test.   On the Night Before the Test:  Be sure to Drink plenty of water.  Do not consume any caffeinated/decaffeinated beverages or chocolate 12 hours prior to your test.  Do not take any antihistamines 12 hours prior to your test.  If you take Metformin do not take 24 hours prior to test.  If the patient has contrast allergy:  Patient will need a prescription for Prednisone and very clear instructions (as follows):  Prednisone 50 mg - take 13 hours prior to test  Take another Prednisone 50 mg 7 hours prior to test  Take another Prednisone 50 mg 1 hour prior to test  Take Benadryl 50 mg 1 hour prior to test  Patient must complete all four doses of above prophylactic medications.  Patient will need a ride after test due to Benadryl.   On the Day of the Test:  Drink plenty of water. Do not drink any water within one hour of the test.  Do not eat any food 4 hours prior to the test.  You may take your regular medications prior to the test.  Take metoprolol (Lopressor) two hours prior to test.  HOLD Furosemide/Hydrochlorothiazide morning of the test.  FEMALES- please wear underwire-free bra if available    *For Clinical Staff only. Please instruct patient the following:*         -Drink plenty of water        -Hold Furosemide/hydrochlorothiazide morning of the test        -Take metoprolol (Lopressor) 2 hours prior to test (if applicable).                  -If HR is less than 55 BPM- No Beta  Blocker                 -IF HR is greater than 55 BPM and patient is less than or equal to 61 yrs old Lopressor 100mg  x1.                 -If HR is greater than 55 BPM and patient is greater than 61 yrs old Lopressor 50 mg x1.       Do not give Lopressor to patients with an allergy to lopressor or anyone with asthma or active COPD symptoms (currently taking steroids).         After the Test:  Drink plenty of water.  After receiving IV contrast, you may experience a mild flushed feeling. This is normal.  On occasion, you may experience a mild rash up to 24 hours after the test. This is not dangerous. If this occurs, you can take Benadryl 25 mg and increase your fluid intake.  If you experience trouble breathing, this can be serious. If it is severe call [phone] IMMEDIATELY. If it is mild, please call our office.  If you take any of these medications: Glipizide/Metformin, Avandament, Glucavance, please do not take 48 hours after completing test.     Please contact  the cardiac imaging nurse navigator should you have any questions/concerns  Rockwell Alexandria, RN Navigator Cardiac Imaging  [facility]  [phone] Office  [phone] Cell

## 2020-02-18 LAB — CMP14+EGFR
ALT: 41 IU/L — ABNORMAL HIGH (ref 0–32)
AST: 24 IU/L (ref 0–40)
Albumin/Globulin Ratio: 1.6 (ref 1.2–2.2)
Albumin: 4.7 g/dL (ref 3.8–4.9)
Alkaline Phosphatase: 77 IU/L (ref 44–121)
BUN/Creatinine Ratio: 19 (ref 12–28)
BUN: 15 mg/dL (ref 8–27)
Bilirubin Total: 0.5 mg/dL (ref 0.0–1.2)
CO2: 24 mmol/L (ref 20–29)
Calcium: 10.3 mg/dL (ref 8.7–10.3)
Chloride: 102 mmol/L (ref 96–106)
Creatinine, Ser: 0.8 mg/dL (ref 0.57–1.00)
GFR calc Af Amer: 93 mL/min/{1.73_m2} (ref >59)
GFR calc non Af Amer: 80 mL/min/{1.73_m2} (ref >59)
Globulin, Total: 2.9 g/dL (ref 1.5–4.5)
Glucose: 79 mg/dL (ref 65–99)
Potassium: 4.2 mmol/L (ref 3.5–5.2)
Sodium: 142 mmol/L (ref 134–144)
Total Protein: 7.6 g/dL (ref 6.0–8.5)

## 2020-02-18 LAB — LIPID PANEL WITH LDL/HDL RATIO
Cholesterol, Total: 239 mg/dL — ABNORMAL HIGH (ref 100–199)
HDL: 63 mg/dL (ref >39)
LDL Chol Calc (NIH): 155 mg/dL — ABNORMAL HIGH (ref 0–99)
LDL/HDL Ratio: 2.5 ratio (ref 0.0–3.2)
Triglycerides: 120 mg/dL (ref 0–149)
VLDL Cholesterol Cal: 21 mg/dL (ref 5–40)

## 2020-02-18 LAB — LDL CHOLESTEROL, DIRECT: LDL Direct: 157 mg/dL — ABNORMAL HIGH (ref 0–99)

## 2020-02-18 NOTE — Progress Notes (Signed)
LDL is elevated, Direct LDL and indirect LDL both correlate.  CMP is normal except minimally elevated ALT, patient's chart shows mild fatty liver history.  Thyroid function test pending.

## 2020-02-18 NOTE — H&P (View-Only) (Signed)
LDL is elevated, Direct LDL and indirect LDL both correlate.  CMP is normal except minimally elevated ALT, patient's chart shows mild fatty liver history.  Thyroid function test pending.

## 2020-02-19 ENCOUNTER — Other Ambulatory Visit: Payer: Self-pay | Admitting: Cardiology

## 2020-02-19 ENCOUNTER — Telehealth: Payer: Self-pay | Admitting: Cardiology

## 2020-02-19 DIAGNOSIS — F418 Other specified anxiety disorders: Secondary | ICD-10-CM

## 2020-02-19 DIAGNOSIS — E7800 Pure hypercholesterolemia, unspecified: Secondary | ICD-10-CM

## 2020-02-19 DIAGNOSIS — I208 Other forms of angina pectoris: Secondary | ICD-10-CM

## 2020-02-19 MED ORDER — ROSUVASTATIN CALCIUM 20 MG PO TABS: 20.0000 mg | ORAL_TABLET | Freq: Every day | ORAL | 2 refills | Status: DC

## 2020-02-19 MED ORDER — ALPRAZOLAM 0.5 MG PO TABS: 0.5000 mg | ORAL_TABLET | Freq: Two times a day (BID) | ORAL | 0 refills | Status: AC | PRN

## 2020-02-19 NOTE — Telephone Encounter (Signed)
.    Called patient to discuss regarding coronary CTA.  Patient has been having frequent episodes of chest pain, she has taken 3 sublingual nitro in the past 3 days with immediate resolution of chest pain.  Chest discomfort described as tightness which radiates to her back and also to the left side of the chest and relieved with nitroglycerin is very indicative of angina pectoris.  As symptoms are gotten worse and now she is having chest discomfort almost on a daily basis, fortunately she has not had any today but last 3 days she had chest discomfort at least one episode, I have recommended that we change and cancel coronary CTA and perform cardiac catheterization.  Schedule for cardiac catheterization, and possible angioplasty. We discussed regarding risks, benefits, alternatives to this including stress testing, CTA and continued medical therapy. Patient wants to proceed. Understands <1-2% risk of death, stroke, MI, urgent CABG, bleeding, infection, renal failure but not limited to these.   I also discussed with her regarding her markedly elevated LDL at 155, patient is willing to start statin, Crestor prescription was sent.  Patient instructed not to do heavy lifting, heavy exertional activity, swimming until evaluation is complete.  Patient instructed to call if symptoms worse or to go to the ED for further evaluation.     ICD-10-CM   1. Stable angina pectoris (HCC)  I20.8   2. Hypercholesteremia  E78.00 rosuvastatin (CRESTOR) 20 MG tablet   No orders of the defined types were placed in this encounter.   Meds ordered this encounter  Medications  . rosuvastatin (CRESTOR) 20 MG tablet    Sig: Take 1 tablet (20 mg total) by mouth daily.    Dispense:  30 tablet    Refill:  2     [name], MD, [facility] 02/19/2020, 2:52 PM Office: [phone] Pager: [phone]

## 2020-02-20 ENCOUNTER — Other Ambulatory Visit (HOSPITAL_COMMUNITY)
Admission: RE | Admit: 2020-02-20 | Discharge: 2020-02-20 | Disposition: A | Discharge status: Home / Self Care | Payer: 59 | Source: Ambulatory Visit | Attending: Cardiology | Admitting: Cardiology

## 2020-02-20 DIAGNOSIS — Z20822 Contact with and (suspected) exposure to covid-19: Secondary | ICD-10-CM | POA: Insufficient documentation

## 2020-02-20 DIAGNOSIS — Z01812 Encounter for preprocedural laboratory examination: Secondary | ICD-10-CM | POA: Diagnosis present

## 2020-02-20 LAB — SARS CORONAVIRUS 2 (TAT 6-24 HRS): SARS Coronavirus 2: NEGATIVE

## 2020-02-21 IMAGING — US US ABDOMEN COMPLETE
1 series · 13 of 25 positions shown · non-contrast
Comparison: None.

CLINICAL DATA: Right upper quadrant abdominal pain.

EXAM:
ABDOMEN ULTRASOUND COMPLETE

[Series 1: us abdomen complete · 0.19mm/px · 13 of 85 slices shown]
[im 1/85]
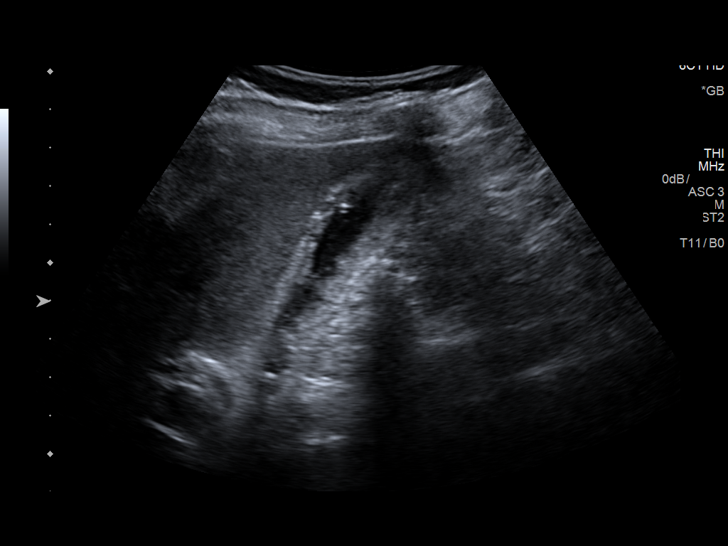
[im 8/85]
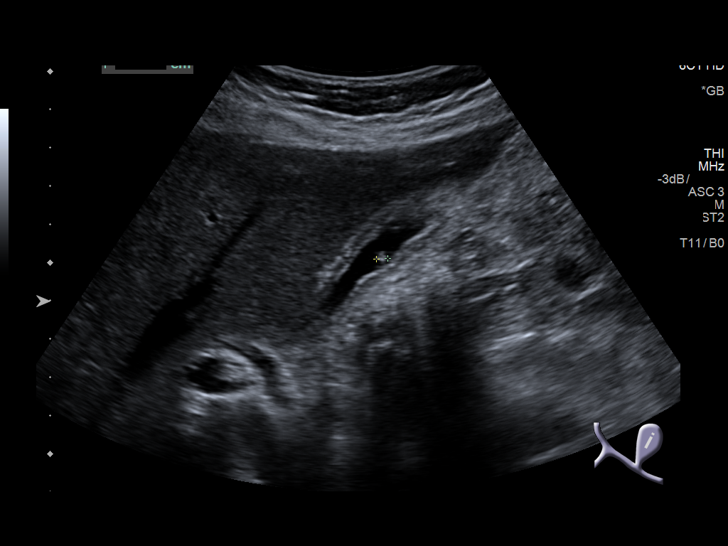
[im 15/85]
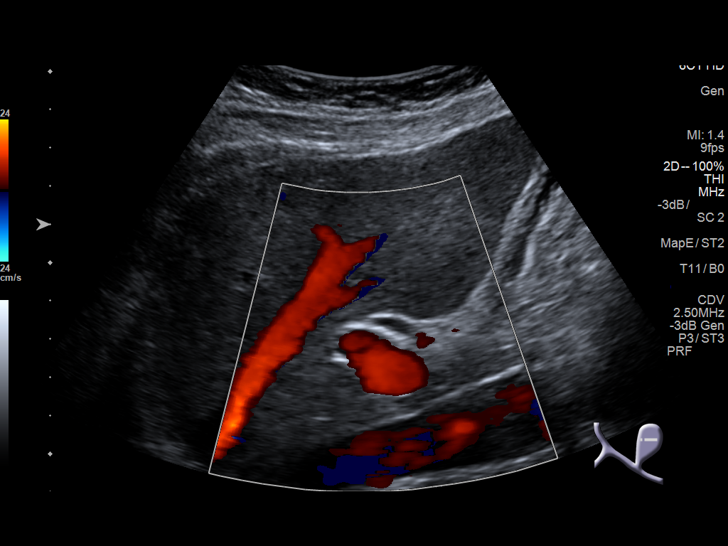
[im 22/85]
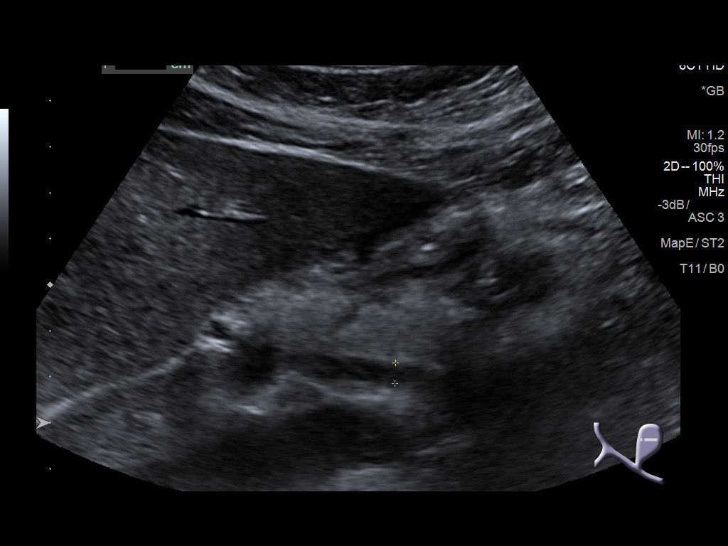
[im 29/85]
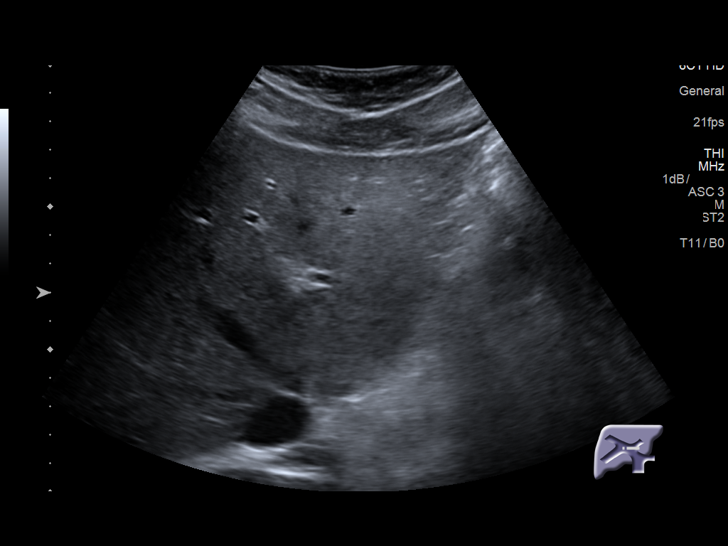
[im 36/85]
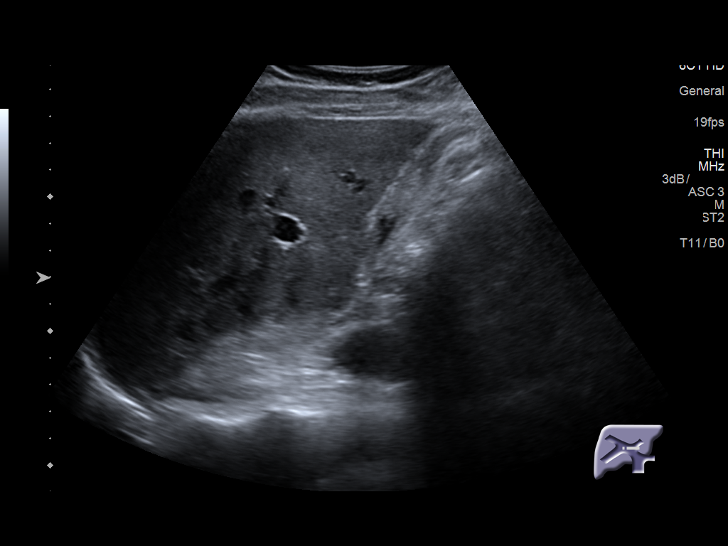
[im 43/85]
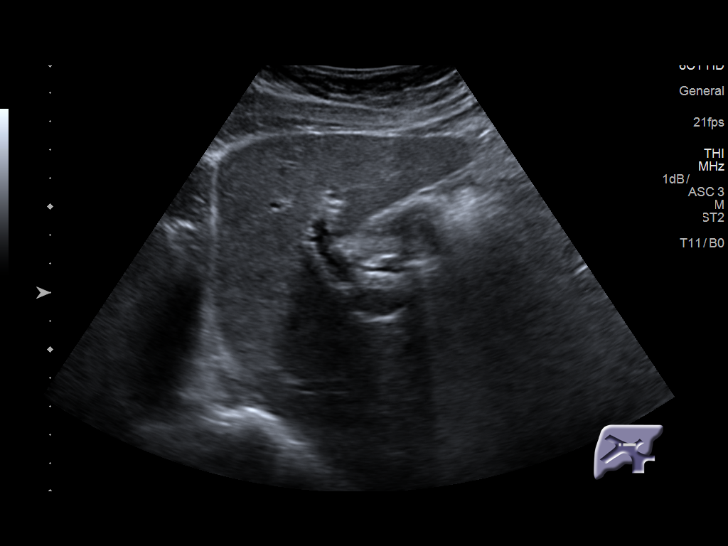
[im 50/85]
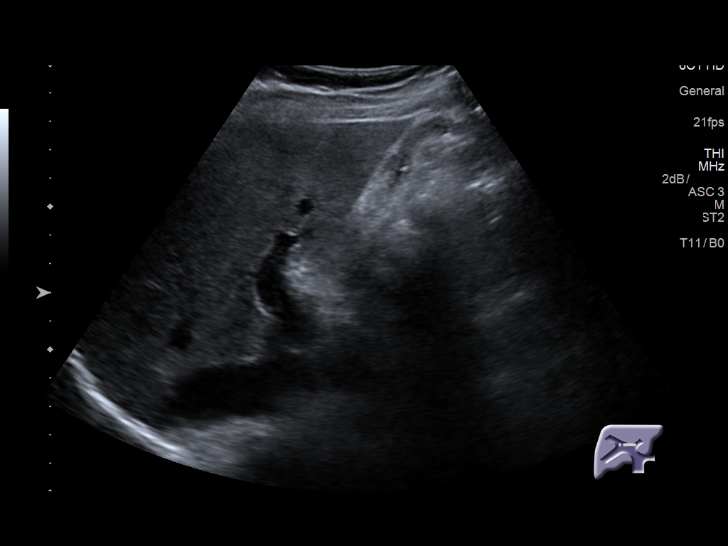
[im 57/85]
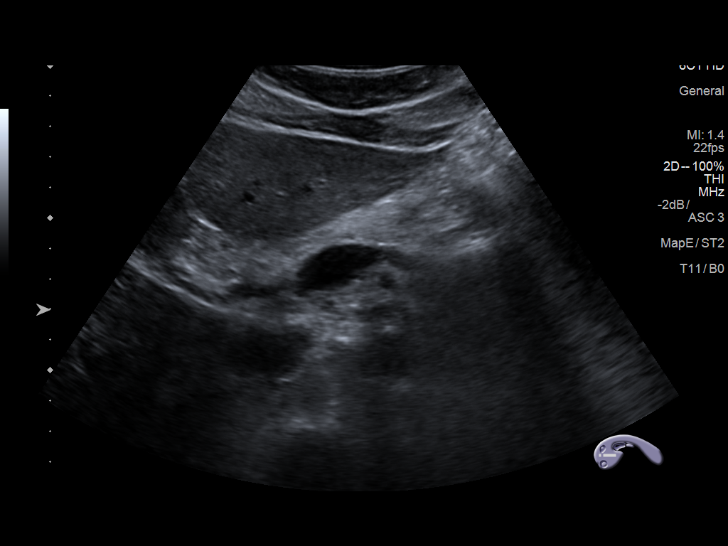
[im 64/85]
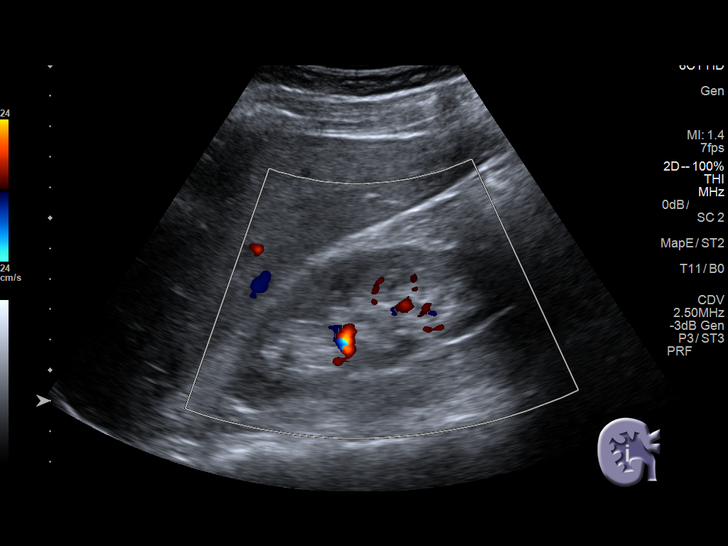
[im 71/85]
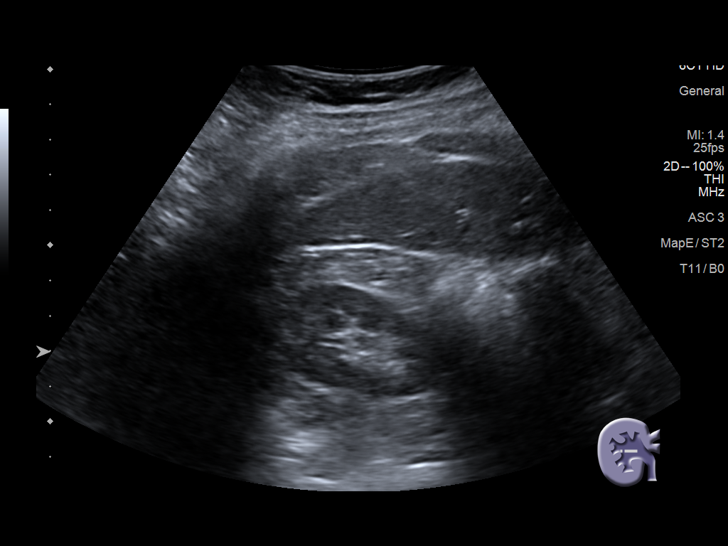
[im 78/85]
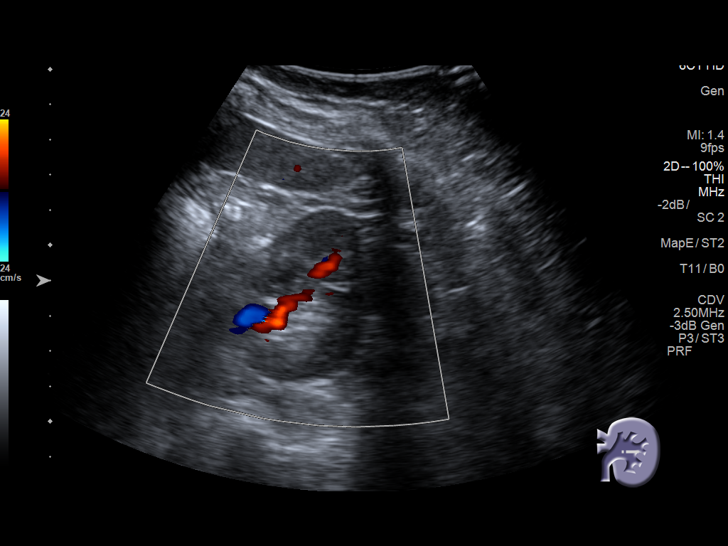
[im 85/85]
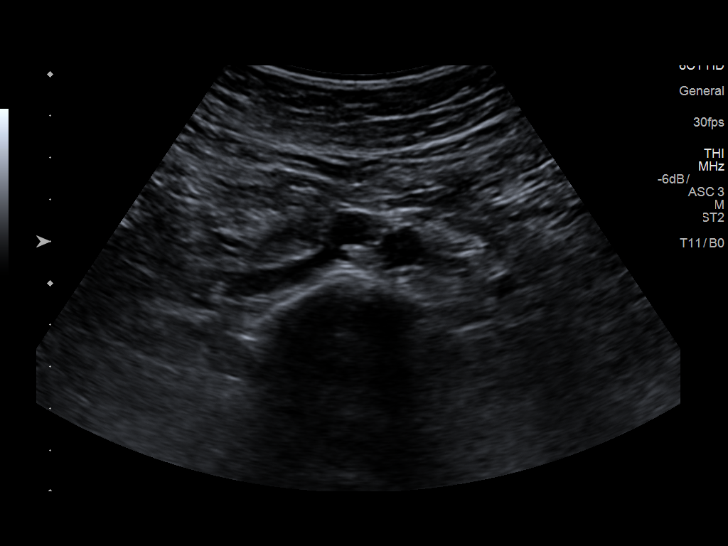

[13 of 25 positions shown; findings below may reference images not displayed]

FINDINGS: Gallbladder: The gallbladder is incompletely distended. A few small
stones are identified. Wall thickness is 0.5 cm. An echogenic,
polypoid lesion in the fundus is identified measuring 1.6 x 1.5 x 1
cm. No pericholecystic fluid. Sonographer reports negative Murphy's
sign. Small gallbladder polyps are seen measuring up to 0.3 cm.

Common bile duct: Diameter: 0.6 cm

Liver: No focal lesion identified. Within normal limits in
parenchymal echogenicity. Portal vein is patent on color Doppler
imaging with normal direction of blood flow towards the liver.

IVC: No abnormality visualized.

Pancreas: Visualized portion unremarkable.

Spleen: Size and appearance within normal limits.

Right Kidney: Length: 10.6 cm. Echogenicity within normal limits. No
mass or hydronephrosis visualized.

Left Kidney: Length: 10.2 cm. Echogenicity within normal limits. No
mass or hydronephrosis visualized.

Abdominal aorta: No aneurysm visualized.

Other findings: None.
IMPRESSION: Gallbladder wall thickening is presumably due to under distention. A
few small stones are identified and there are small polyps measuring
up to 0.3 cm. Echogenic focus in the fundus of the gallbladder could
be due to tumefactive sludge but may also represent an intraluminal
mass such as a benign adenoma, polyp or adenocarcinoma.

## 2020-02-23 DIAGNOSIS — I209 Angina pectoris, unspecified: Secondary | ICD-10-CM | POA: Diagnosis present

## 2020-02-23 DIAGNOSIS — I208 Other forms of angina pectoris: Secondary | ICD-10-CM

## 2020-02-23 DIAGNOSIS — I2089 Other forms of angina pectoris: Secondary | ICD-10-CM

## 2020-02-24 ENCOUNTER — Other Ambulatory Visit: Payer: 59

## 2020-02-24 ENCOUNTER — Ambulatory Visit (HOSPITAL_COMMUNITY)
Admission: RE | Disposition: A | Discharge status: Home / Self Care | Payer: Self-pay | Source: Home / Self Care | Attending: Cardiology

## 2020-02-24 ENCOUNTER — Ambulatory Visit (HOSPITAL_COMMUNITY)
Admission: RE | Admit: 2020-02-24 | Discharge: 2020-02-24 | Disposition: A | Discharge status: Home / Self Care | Payer: 59 | Attending: Cardiology | Admitting: Cardiology

## 2020-02-24 DIAGNOSIS — I209 Angina pectoris, unspecified: Secondary | ICD-10-CM | POA: Diagnosis present

## 2020-02-24 DIAGNOSIS — I25119 Atherosclerotic heart disease of native coronary artery with unspecified angina pectoris: Secondary | ICD-10-CM | POA: Diagnosis present

## 2020-02-24 DIAGNOSIS — I2089 Other forms of angina pectoris: Secondary | ICD-10-CM

## 2020-02-24 DIAGNOSIS — Z20822 Contact with and (suspected) exposure to covid-19: Secondary | ICD-10-CM | POA: Diagnosis not present

## 2020-02-24 DIAGNOSIS — I208 Other forms of angina pectoris: Secondary | ICD-10-CM

## 2020-02-24 HISTORY — PX: LEFT HEART CATH AND CORONARY ANGIOGRAPHY: CATH118249

## 2020-02-24 LAB — CBC
HCT: 41.5 % (ref 36.0–46.0)
Hemoglobin: 14.3 g/dL (ref 12.0–15.0)
MCH: 31.8 pg (ref 26.0–34.0)
MCHC: 34.5 g/dL (ref 30.0–36.0)
MCV: 92.4 fL (ref 80.0–100.0)
Platelets: 351 10*3/uL (ref 150–400)
RBC: 4.49 MIL/uL (ref 3.87–5.11)
RDW: 12.6 % (ref 11.5–15.5)
WBC: 6.9 10*3/uL (ref 4.0–10.5)
nRBC: 0 % (ref 0.0–0.2)

## 2020-02-24 SURGERY — LEFT HEART CATH AND CORONARY ANGIOGRAPHY
Anesthesia: LOCAL

## 2020-02-24 MED ORDER — FENTANYL CITRATE (PF) 100 MCG/2ML IJ SOLN
INTRAMUSCULAR | Status: DC | PRN
  Administered 2020-02-24: 50 ug via INTRAVENOUS

## 2020-02-24 MED ORDER — NITROGLYCERIN 1 MG/10 ML FOR IR/CATH LAB
INTRA_ARTERIAL | Status: DC | PRN
  Administered 2020-02-24: 200 ug

## 2020-02-24 MED ORDER — AMLODIPINE BESYLATE 2.5 MG PO TABS: 2.5000 mg | ORAL_TABLET | Freq: Every day | ORAL | 2 refills | Status: DC

## 2020-02-24 MED ORDER — HEPARIN (PORCINE) IN NACL 1000-0.9 UT/500ML-% IV SOLN
INTRAVENOUS | Status: AC
  Filled 2020-02-24: qty 1000

## 2020-02-24 MED ORDER — VERAPAMIL HCL 2.5 MG/ML IV SOLN
INTRAVENOUS | Status: AC
  Filled 2020-02-24: qty 2

## 2020-02-24 MED ORDER — SODIUM CHLORIDE 0.9 % WEIGHT BASED INFUSION
3.0000 mL/kg/h | INTRAVENOUS | Status: DC
  Administered 2020-02-24: 3 mL/kg/h via INTRAVENOUS

## 2020-02-24 MED ORDER — SODIUM CHLORIDE 0.9 % WEIGHT BASED INFUSION: 1.0000 mL/kg/h | INTRAVENOUS | Status: DC

## 2020-02-24 MED ORDER — ASPIRIN 81 MG PO CHEW: 81.0000 mg | CHEWABLE_TABLET | ORAL | Status: DC

## 2020-02-24 MED ORDER — MIDAZOLAM HCL 2 MG/2ML IJ SOLN
INTRAMUSCULAR | Status: DC | PRN
  Administered 2020-02-24: 2 mg via INTRAVENOUS

## 2020-02-24 MED ORDER — SODIUM CHLORIDE 0.9% FLUSH: 3.0000 mL | Freq: Two times a day (BID) | INTRAVENOUS | Status: DC

## 2020-02-24 MED ORDER — IOHEXOL 350 MG/ML SOLN
INTRAVENOUS | Status: DC | PRN
  Administered 2020-02-24: 60 mL

## 2020-02-24 MED ORDER — LIDOCAINE HCL (PF) 1 % IJ SOLN
INTRAMUSCULAR | Status: DC | PRN
  Administered 2020-02-24: 2 mL

## 2020-02-24 MED ORDER — NITROGLYCERIN IN D5W 200-5 MCG/ML-% IV SOLN
INTRAVENOUS | Status: AC
  Filled 2020-02-24: qty 250

## 2020-02-24 MED ORDER — SODIUM CHLORIDE 0.9 % IV SOLN: 250.0000 mL | INTRAVENOUS | Status: DC | PRN

## 2020-02-24 MED ORDER — FENTANYL CITRATE (PF) 100 MCG/2ML IJ SOLN
INTRAMUSCULAR | Status: AC
  Filled 2020-02-24: qty 2

## 2020-02-24 MED ORDER — SODIUM CHLORIDE 0.9% FLUSH: 3.0000 mL | INTRAVENOUS | Status: DC | PRN

## 2020-02-24 MED ORDER — HEPARIN SODIUM (PORCINE) 1000 UNIT/ML IJ SOLN
INTRAMUSCULAR | Status: AC
  Filled 2020-02-24: qty 1

## 2020-02-24 MED ORDER — LIDOCAINE HCL (PF) 1 % IJ SOLN
INTRAMUSCULAR | Status: AC
  Filled 2020-02-24: qty 30

## 2020-02-24 MED ORDER — MIDAZOLAM HCL 2 MG/2ML IJ SOLN
INTRAMUSCULAR | Status: AC
  Filled 2020-02-24: qty 2

## 2020-02-24 MED ORDER — HEPARIN (PORCINE) IN NACL 1000-0.9 UT/500ML-% IV SOLN
INTRAVENOUS | Status: DC | PRN
  Administered 2020-02-24 (×2): 500 mL

## 2020-02-24 MED ORDER — NITROGLYCERIN 1 MG/10 ML FOR IR/CATH LAB
INTRA_ARTERIAL | Status: AC
  Filled 2020-02-24: qty 10

## 2020-02-24 MED ORDER — HEPARIN SODIUM (PORCINE) 1000 UNIT/ML IJ SOLN
INTRAMUSCULAR | Status: DC | PRN
  Administered 2020-02-24: 3000 [IU] via INTRAVENOUS

## 2020-02-24 MED ORDER — VERAPAMIL HCL 2.5 MG/ML IV SOLN
INTRAVENOUS | Status: DC | PRN
  Administered 2020-02-24: 10 mL via INTRA_ARTERIAL

## 2020-02-24 SURGICAL SUPPLY — 9 items
CATH OPTITORQUE TIG 4.0 5F (CATHETERS) ×2 IMPLANT
DEVICE RAD TR BAND REGULAR (VASCULAR PRODUCTS) ×2 IMPLANT
GLIDESHEATH SLEND A-KIT 6F 22G (SHEATH) ×2 IMPLANT
GUIDEWIRE INQWIRE 1.5J.035X260 (WIRE) ×1 IMPLANT
INQWIRE 1.5J .035X260CM (WIRE) ×2
KIT HEART LEFT (KITS) ×2 IMPLANT
PACK CARDIAC CATHETERIZATION (CUSTOM PROCEDURE TRAY) ×2 IMPLANT
TRANSDUCER W/STOPCOCK (MISCELLANEOUS) ×2 IMPLANT
TUBING CIL FLEX 10 FLL-RA (TUBING) ×2 IMPLANT

## 2020-02-24 NOTE — Interval H&P Note (Signed)
History and Physical Interval Note:  02/24/2020 2:21 PM  Gail Henderson  has presented today for surgery, with the diagnosis of chest pain, angina.  The various methods of treatment have been discussed with the patient and family. After consideration of risks, benefits and other options for treatment, the patient has consented to  Procedure(s): LEFT HEART CATH AND CORONARY ANGIOGRAPHY (N/A) and possible angioplasty as a surgical intervention.  The patient's history has been reviewed, patient examined, no change in status, stable for surgery.  I have reviewed the patient's chart and labs.  Questions were answered to the patient's satisfaction.   Please see my progress note regarding updated symptoms, patient continued to have frequent episodes of angina suggestive of intermediate coronary syndrome, in view of this and her preference, we decided to proceed with cardiac catheterization.  Cath Lab Visit (complete for each Cath Lab visit)  Clinical Evaluation Leading to the Procedure:   ACS: No.  Non-ACS:    Anginal Classification: CCS III  Anti-ischemic medical therapy: Minimal Therapy (1 class of medications)  Non-Invasive Test Results: No non-invasive testing performed  Prior CABG: No previous CABG  [name]

## 2020-02-24 NOTE — Progress Notes (Signed)
Patient was given discharge instructions. She verbalized understanding. 

## 2020-02-24 NOTE — Discharge Instructions (Signed)
Drink plenty of fluid for 48 hours and keep wrist elevated at heart level for 24 hours  Radial Site Care   This sheet gives you information about how to care for yourself after your procedure. Your health care provider may also give you more specific instructions. If you have problems or questions, contact your health care provider. What can I expect after the procedure? After the procedure, it is common to have:  Bruising and tenderness at the catheter insertion area. Follow these instructions at home: Medicines  Take over-the-counter and prescription medicines only as told by your health care provider. Insertion site care 1. Follow instructions from your health care provider about how to take care of your insertion site. Make sure you: ? Wash your hands with soap and water before you change your bandage (dressing). If soap and water are not available, use hand sanitizer. ? remove your dressing as told by your health care provider. In 24 hours 2. Check your insertion site every day for signs of infection. Check for: ? Redness, swelling, or pain. ? Fluid or blood. ? Pus or a bad smell. ? Warmth. 3. Do not take baths, swim, or use a hot tub until your health care provider approves. 4. You may shower 24-48 hours after the procedure, or as directed by your health care provider. ? Remove the dressing and gently wash the site with plain soap and water. ? Pat the area dry with a clean towel. ? Do not rub the site. That could cause bleeding. 5. Do not apply powder or lotion to the site. Activity   1. For 24 hours after the procedure, or as directed by your health care provider: ? Do not flex or bend the affected arm. ? Do not push or pull heavy objects with the affected arm. ? Do not drive yourself home from the hospital or clinic. You may drive 24 hours after the procedure unless your health care provider tells you not to. ? Do not operate machinery or power tools. 2. Do not lift  anything that is heavier than 10 lb (4.5 kg), or the limit that you are told, until your health care provider says that it is safe. For 4 days 3. Ask your health care provider when it is okay to: ? Return to work or school. ? Resume usual physical activities or sports. ? Resume sexual activity. General instructions  If the catheter site starts to bleed, raise your arm and put firm pressure on the site. If the bleeding does not stop, get help right away. This is a medical emergency.  If you went home on the same day as your procedure, a responsible adult should be with you for the first 24 hours after you arrive home.  Keep all follow-up visits as told by your health care provider. This is important. Contact a health care provider if:  You have a fever.  You have redness, swelling, or yellow drainage around your insertion site. Get help right away if:  You have unusual pain at the radial site.  The catheter insertion area swells very fast.  The insertion area is bleeding, and the bleeding does not stop when you hold steady pressure on the area.  Your arm or hand becomes pale, cool, tingly, or numb. These symptoms may represent a serious problem that is an emergency. Do not wait to see if the symptoms will go away. Get medical help right away. Call your local emergency services (911 in the [place] Do not   drive yourself to the hospital. Summary  After the procedure, it is common to have bruising and tenderness at the site.  Follow instructions from your health care provider about how to take care of your radial site wound. Check the wound every day for signs of infection.  Do not lift anything that is heavier than 10 lb (4.5 kg), or the limit that you are told, until your health care provider says that it is safe. This information is not intended to replace advice given to you by your health care provider. Make sure you discuss any questions you have with your health care  provider. Document Revised: 02/14/2017 Document Reviewed: 02/14/2017 Elsevier Patient Education  2020 [employer]  

## 2020-02-25 ENCOUNTER — Encounter (HOSPITAL_COMMUNITY): Payer: Self-pay | Admitting: Cardiology

## 2020-02-25 MED FILL — Nitroglycerin IV Soln 200 MCG/ML in D5W: INTRAVENOUS | Qty: 250 | Status: AC

## 2020-02-26 ENCOUNTER — Telehealth: Payer: Self-pay

## 2020-02-26 NOTE — Telephone Encounter (Signed)
Patient recently had a radial access cath and wants to know if she will be ok to return to work on 03/08/2020 with no restrictions. 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 Please advise.

## 2020-02-26 NOTE — Telephone Encounter (Signed)
Yes

## 2020-02-27 NOTE — Telephone Encounter (Signed)
Called and left a message on Vmbox, regarding her returning to work.

## 2020-03-02 NOTE — Telephone Encounter (Signed)
Called and left message again. NA, LMAM to confirm she received message

## 2020-03-03 NOTE — Telephone Encounter (Signed)
Pt called and stated that she is taking amlodipine 2.5mg , rosuvastatin 20mg . She has been feeling weak and gets winded while walking around the house. Pt has not yet returned to work. She is not sure if it is the medication or not, she would like to know what she can do to feel better.

## 2020-03-03 NOTE — Telephone Encounter (Signed)
My response to the patient   you may discontinue Amlodipine. Pick up Arginaid (Arginine supplement) from anywhere and try one packet daily (increases natural nitric oxide). You will settle down. Also drink Pedialyte to increase your BP and you can be liberal with salt intake.   If you use My Chart, I will be able to respond directly to you.  Medications Discontinued During This Encounter  Medication Reason  . amLODipine (NORVASC) 2.5 MG tablet Side effect (s)   Low BP and dizziness  [name], MD, [facility] 03/03/2020, 4:47 PM Office: [phone] Pager: [phone]

## 2020-03-12 ENCOUNTER — Ambulatory Visit: Payer: 59 | Admitting: Cardiology

## 2020-03-16 ENCOUNTER — Telehealth: Payer: Self-pay

## 2020-03-16 ENCOUNTER — Ambulatory Visit: Payer: 59 | Admitting: Student

## 2020-03-16 ENCOUNTER — Ambulatory Visit: Payer: 59 | Admitting: Cardiology

## 2020-03-16 NOTE — Progress Notes (Deleted)
Primary Physician/Referring:  Audley Hose, MD  Patient ID: Gail Henderson, female    DOB: 08-23-1960, 60 y.o.   MRN: [id number]  No chief complaint on file.  HPI:    TEEGHAN Henderson  is a 60 y.o. Caucasian female with no significant prior cardiovascular history, no premature coronary artery disease in family for CAD, non-smoker and nondiabetic referred on urgent basis by Dr. 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  Patient symptoms started 2 weeks ago while she was walking her dog and had palpitations and chest discomfort in the form of tightness.  Episode resolved after she had rested when returned home.  Since then she has noticed anytime she is anxious or stressed or with exertion she is having occasional episodes of chest tightness.  No other associated symptoms.  ***Patient presents for follow up after undergoing left heart catheterization on 02/24/2020. Coronary angiography revealed microvascular disease with slow flow, which improved with nitroglycerin administration. 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 Unfortunately patient did not tolerate amlodipine due to dizziness and low blood pressure.   ***Patient is now   Past Medical History:  Diagnosis Date  . Fatty liver    showed up on the gallbladder ultrasound; liver enzymes normal  . Gallbladder polyp   . Gallstone   . Hypothyroidism   . PONV (postoperative nausea and vomiting)    Past Surgical History:  Procedure Laterality Date  . ABDOMINAL HYSTERECTOMY  2009   BSO  . CESAREAN SECTION  1999  . CHOLECYSTECTOMY N/A 08/16/2017   Procedure: LAPAROSCOPIC CHOLECYSTECTOMY;  Surgeon: Coralie Keens, MD;  Location: [facility];  Service: General;  Laterality: N/A;  . COLONOSCOPY  2013   . DIAGNOSTIC LAPAROSCOPY  1992   ovarian cyst right  . LEFT HEART CATH AND CORONARY ANGIOGRAPHY N/A 02/24/2020   Procedure: LEFT HEART CATH AND CORONARY ANGIOGRAPHY;  Surgeon: Adrian Prows, MD;  Location: [facility];  Service: Cardiovascular;  Laterality: N/A;   Family History  Problem Relation Age of Onset  . Heart attack Mother   . Hypertension Sister   . Diabetes Brother     Social History   Tobacco Use  . Smoking status: Never Smoker  . Smokeless tobacco: Never Used  Substance Use Topics  . Alcohol use: Yes    Alcohol/week: 1.0 standard drink    Types: 1 Glasses of wine per week   Marital Status: Married  ROS  Review of Systems  Cardiovascular: Positive for chest pain and palpitations. Negative for dyspnea on exertion and leg swelling.  Gastrointestinal: Negative for melena.   Objective  There were no vitals taken for this visit.  Vitals with BMI 02/24/2020 02/24/2020 02/24/2020  Height - - -  Weight - - -  BMI - - -  Systolic 974 163 845  Diastolic 71 63 67  Pulse 58 50 49     Physical Exam Cardiovascular:     Rate and Rhythm: Normal rate and regular rhythm.     Pulses: Intact distal pulses.     Heart sounds: Normal heart sounds. No murmur heard. No gallop.      Comments: No leg edema, no JVD. Pulmonary:     Effort: Pulmonary effort is normal.     Breath sounds: Normal breath sounds.  Abdominal:  General: Bowel sounds are normal.     Palpations: Abdomen is soft.    Laboratory examination:   Recent Labs    02/17/20 1400  NA 142  K 4.2  CL 102  CO2 24  GLUCOSE 79  BUN 15  CREATININE 0.80  CALCIUM 10.3  GFRNONAA 80  GFRAA 93   CrCl cannot be calculated (Patient's most recent lab result is older than the maximum 21 days allowed.).  CMP Latest Ref Rng & Units 02/17/2020  Glucose 65 - 99 mg/dL 79  BUN 8 - 27 mg/dL 15  Creatinine 0.57 - 1.00 mg/dL 0.80  Sodium 134 - 144 mmol/L 142  Potassium 3.5 - 5.2 mmol/L 4.2  Chloride 96 - 106 mmol/L 102   CO2 20 - 29 mmol/L 24  Calcium 8.7 - 10.3 mg/dL 10.3  Total Protein 6.0 - 8.5 g/dL 7.6  Total Bilirubin 0.0 - 1.2 mg/dL 0.5  Alkaline Phos 44 - 121 IU/L 77  AST 0 - 40 IU/L 24  ALT 0 - 32 IU/L 41(H)   CBC Latest Ref Rng & Units 02/24/2020 08/14/2017  WBC 4.0 - 10.5 K/uL 6.9 8.1  Hemoglobin 12.0 - 15.0 g/dL 14.3 14.7  Hematocrit 36.0 - 46.0 % 41.5 43.3  Platelets 150 - 400 K/uL 351 355    Lipid Panel Recent Labs    02/17/20 1400  CHOL 239*  TRIG 120  LDLCALC 155*  HDL 63  LDLDIRECT 157*    HEMOGLOBIN A1C No results found for: HGBA1C, MPG TSH No results for input(s): TSH in the last 8760 hours.  External labs:   Labs 08/01/2018:  Hb 14.5/HCT 43.3, platelets 379.  Serum glucose 1 3 mg, BUN 13, creatinine 0.83, EGFR >78 mL.  Potassium 4.4.  CMP otherwise normal.  Total cholesterol 219, triglycerides 134, HDL 67, LDL 125.  A1c 5.4%.  TSH normal at 2.220.  C-reactive protein 0.82.  Medications and allergies   Allergies  Allergen Reactions  . Milk Protein     eczema  . Wheat Bran     Migraine headaches  . Other Rash    TEGADERM FILM--rash/irritated skin     Outpatient Medications Prior to Visit  Medication Sig Dispense Refill  . amphetamine-dextroamphetamine (ADDERALL) 5 MG tablet Take 2.5 mg by mouth daily.  0  . Cholecalciferol (VITAMIN D3) 2000 units TABS Take 2,000 Units by mouth daily.    Marland Kitchen docusate sodium (COLACE) 100 MG capsule Take 100 mg by mouth 2 (two) times daily as needed for mild constipation.    . famotidine-calcium carbonate-magnesium hydroxide (PEPCID COMPLETE) 10-800-165 MG chewable tablet Chew 1 tablet by mouth daily as needed (indigestion/heartburn).    . hydrocortisone valerate cream (WESTCORT) 0.2 % Apply 1 application topically 2 (two) times daily as needed. For suppurativa dermatitis  0  . L-METHYLFOLATE PO Take 5 mg by mouth daily.    . melatonin 5 MG TABS Take 5 mg by mouth at bedtime.    . Methylcobalamin 5000 MCG TBDP Take 5,000  mcg by mouth daily.    . Misc Natural Products (OSTEO BI-FLEX ADV DOUBLE ST PO) Take 1 tablet by mouth 2 (two) times daily as needed (osteoarthritis pain.).    Marland Kitchen Misc Natural Products (PROGESTERONE EX) Apply 1 application topically every evening. Progesterone Cream 5% Compounded by [employer]    . Misc Natural Products (TART CHERRY ADVANCED) CAPS Take 1 capsule by mouth 2 (two) times daily as needed (osteoarthritis pain.). W/Turmeric & Bromelain    . nitroGLYCERIN (NITROSTAT) 0.4  MG SL tablet Place 1 tablet (0.4 mg total) under the tongue every 5 (five) minutes as needed for up to 25 days for chest pain. (Patient taking differently: Place 0.4 mg under the tongue every 5 (five) minutes x 3 doses as needed for chest pain.) 25 tablet 3  . NONFORMULARY OR COMPOUNDED ITEM Apply 1 application topically every evening. BIEST CREAM 70-30 Compounded by [employer]    . pantoprazole (PROTONIX) 40 MG tablet Take 40 mg by mouth daily as needed (indigestion/heartburn.).    Marland Kitchen pyridOXINE (B-6) 50 MG tablet Take 50 mg by mouth daily. P5P 50    . rosuvastatin (CRESTOR) 20 MG tablet Take 1 tablet (20 mg total) by mouth daily. (Patient taking differently: Take 20 mg by mouth every evening.) 30 tablet 2  . sucralfate (CARAFATE) 1 g tablet Take 1 g by mouth at bedtime. Mix with water    . TESTOSTERONE NA Apply 1 application topically daily. Testosterone Topical Cream 2% Compounded by [employer]    . thyroid (ARMOUR) 60 MG tablet Take 60 mg by mouth daily before breakfast.    . VITAMIN K PO Take 90 mcg by mouth daily. MK-7     No facility-administered medications prior to visit.    Radiology:   No results found.  Cardiac Studies:    EKG:       EKG 02/17/2020: Normal sinus rhythm at rate of 61 bpm, normal axis, no evidence of ischemia, normal EKG.     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 Hypercholesteremia  E78.00       No orders of the  defined types were placed in this encounter.  No orders of the defined types were placed in this encounter.   Recommendations:   KUSHI KUN is a 60 y.o. Caucasian female with no significant prior cardiovascular history, no premature coronary artery disease in family for CAD, non-smoker and nondiabetic referred on urgent basis by Dr. 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  Symptoms clearly are brought on by exertion and also by mental stress which are indicative of angina pectoris.  I reviewed her external labs she also has mild hyperlipidemia and hyperglycemia as well.  I have started her on aspirin 81 mg daily, metoprolol tartrate 50 mg twice daily, her blood pressure was elevated today, probably related to stress.  We had a long discussion regarding nuclear stress testing versus coronary CT angiogram.  Patient prefers to have coronary CTA, keeping her heart rate low was discussed with the patient, she is a Immunologist and understands angina pectoris well.  I have also given her a prescription for sublingual nitroglycerin and advised her to not to do any heavy exertional activity.  If she has any pain that is not easily relieved with rest or is continuous, she needs to go to the emergency room.  I will repeat labs including CMP, lipids and she is presently on thyroid supplement due to marked fatigue after she underwent hysterectomy and she is also on HRT, will obtain thyroid panel.  I will keep PCP informed.  I will see her back after the coronary CTA, tentatively set up office visit in 4 weeks.  Will schedule for an echocardiogram.  Patient is not vaccinated against COVID-19.  I have discussed with  her regarding high numbers of COVID-19 positive patients in the hospital were mostly not vaccinated and encouraged her to do research on this  aspect and encouraged her to get vaccinated.  ***Patient presents for follow up after cardiac catheterization on 02/24/2020.   ***dilt 240 or isosorbidde mononitrate.   ***

## 2020-03-17 ENCOUNTER — Other Ambulatory Visit: Payer: Self-pay

## 2020-03-17 ENCOUNTER — Ambulatory Visit: Payer: 59 | Admitting: Cardiology

## 2020-03-17 ENCOUNTER — Encounter: Payer: Self-pay | Admitting: Cardiology

## 2020-03-17 VITALS — BP 115/77 | HR 67 | Temp 97.8°F | Resp 16 | Ht 64.0 in | Wt 159.0 lb

## 2020-03-17 DIAGNOSIS — I208 Other forms of angina pectoris: Secondary | ICD-10-CM

## 2020-03-17 DIAGNOSIS — E7800 Pure hypercholesterolemia, unspecified: Secondary | ICD-10-CM

## 2020-03-17 MED ORDER — RANOLAZINE ER 1000 MG PO TB12: 1000.0000 mg | ORAL_TABLET | Freq: Two times a day (BID) | ORAL | 3 refills | Status: DC

## 2020-03-17 NOTE — Progress Notes (Unsigned)
Primary Physician/Referring:  Audley Hose, MD  Patient ID: Gail Henderson, female    DOB: 1960-10-30, 60 y.o.   MRN: [id number]  No chief complaint on file.  HPI:    Gail Henderson  is a 60 y.o. Caucasian female with no significant prior cardiovascular history, no premature coronary artery disease in family for CAD, non-smoker and nondiabetic referred on urgent basis by Dr. 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  Patient was initially set up for coronary CTA however due to persistent symptoms of chest pain suggestive of angina, she underwent cardiac catheterization on 02/24/2020 which revealed normal coronary arteries but very slow flow which improved with intracoronary nitroglycerin administration.  She was diagnosed with microvascular angina.  She now presents here for follow-up.  Over the past 2 weeks she has started to feel better but has been using nitroglycerin almost on a regular basis at least once or sometimes even twice a day.  Over the past 1 week symptoms have gotten better however she started her work back and states that after 10 hours of work she had to take nitroglycerin and had to sit down and felt very fatigued.  Accompanied by her husband.   Past Medical History:  Diagnosis Date  . Fatty liver    showed up on the gallbladder ultrasound; liver enzymes normal  . Gallbladder polyp   . Gallstone   . Hypothyroidism   . PONV (postoperative nausea and vomiting)    Past Surgical History:  Procedure Laterality Date  . ABDOMINAL HYSTERECTOMY  2009   [facility]  . CESAREAN SECTION  1999  . CHOLECYSTECTOMY N/A 08/16/2017   Procedure: LAPAROSCOPIC CHOLECYSTECTOMY;  Surgeon: Coralie Keens, MD;  Location: [facility];  Service: General;  Laterality: N/A;  . COLONOSCOPY  2013  . DIAGNOSTIC LAPAROSCOPY  1992   ovarian  cyst right  . LEFT HEART CATH AND CORONARY ANGIOGRAPHY N/A 02/24/2020   Procedure: LEFT HEART CATH AND CORONARY ANGIOGRAPHY;  Surgeon: Adrian Prows, MD;  Location: [facility];  Service: Cardiovascular;  Laterality: N/A;   Family History  Problem Relation Age of Onset  . Heart attack Mother   . Hypertension Sister   . Diabetes Brother     Social History   Tobacco Use  . Smoking status: Never Smoker  . Smokeless tobacco: Never Used  Substance Use Topics  . Alcohol use: Yes    Alcohol/week: 1.0 standard drink    Types: 1 Glasses of wine per week   Marital Status: Married  ROS  Review of Systems  Cardiovascular: Positive for chest pain and palpitations. Negative for dyspnea on exertion and leg swelling.  Gastrointestinal: Negative for melena.   Objective  There were no vitals taken for this visit.  Vitals with BMI 02/24/2020 02/24/2020 02/24/2020  Height - - -  Weight - - -  BMI - - -  Systolic [phone]  Diastolic 71 63 67  Pulse 58 50 49     Physical Exam Cardiovascular:     Rate and Rhythm: Normal rate and regular rhythm.     Pulses: Intact distal pulses.     Heart sounds: Normal heart sounds. No murmur heard. No gallop.      Comments: No leg edema, no JVD. Pulmonary:     Effort: Pulmonary effort is normal.  Breath sounds: Normal breath sounds.  Abdominal:     General: Bowel sounds are normal.     Palpations: Abdomen is soft.    Laboratory examination:   Recent Labs    02/17/20 1400  NA 142  K 4.2  CL 102  CO2 24  GLUCOSE 79  BUN 15  CREATININE 0.80  CALCIUM 10.3  GFRNONAA 80  GFRAA 93   CrCl cannot be calculated (Patient's most recent lab result is older than the maximum 21 days allowed.).  CMP Latest Ref Rng & Units 02/17/2020  Glucose 65 - 99 mg/dL 79  BUN 8 - 27 mg/dL 15  Creatinine 0.57 - 1.00 mg/dL 0.80  Sodium 134 - 144 mmol/L 142  Potassium 3.5 - 5.2 mmol/L 4.2  Chloride 96 - 106 mmol/L 102  CO2 20 - 29 mmol/L 24  Calcium 8.7 - 10.3  mg/dL 10.3  Total Protein 6.0 - 8.5 g/dL 7.6  Total Bilirubin 0.0 - 1.2 mg/dL 0.5  Alkaline Phos 44 - 121 IU/L 77  AST 0 - 40 IU/L 24  ALT 0 - 32 IU/L 41(H)   CBC Latest Ref Rng & Units 02/24/2020 08/14/2017  WBC 4.0 - 10.5 K/uL 6.9 8.1  Hemoglobin 12.0 - 15.0 g/dL 14.3 14.7  Hematocrit 36.0 - 46.0 % 41.5 43.3  Platelets 150 - 400 K/uL 351 355    Lipid Panel Recent Labs    02/17/20 1400  CHOL 239*  TRIG 120  LDLCALC 155*  HDL 63  LDLDIRECT 157*    HEMOGLOBIN A1C No results found for: HGBA1C, MPG TSH No results for input(s): TSH in the last 8760 hours.  External labs:   Labs 08/01/2018:  Hb 14.5/HCT 43.3, platelets 379.  Serum glucose 1 3 mg, BUN 13, creatinine 0.83, EGFR >78 mL.  Potassium 4.4.  CMP otherwise normal.  Total cholesterol 219, triglycerides 134, HDL 67, LDL 125.  A1c 5.4%.  TSH normal at 2.220.  C-reactive protein 0.82.  Medications and allergies   Allergies  Allergen Reactions  . Milk Protein     eczema  . Wheat Bran     Migraine headaches  . Other Rash    TEGADERM FILM--rash/irritated skin     Outpatient Medications Prior to Visit  Medication Sig Dispense Refill  . amphetamine-dextroamphetamine (ADDERALL) 5 MG tablet Take 2.5 mg by mouth daily.  0  . Cholecalciferol (VITAMIN D3) 2000 units TABS Take 2,000 Units by mouth daily.    Marland Kitchen docusate sodium (COLACE) 100 MG capsule Take 100 mg by mouth 2 (two) times daily as needed for mild constipation.    . famotidine-calcium carbonate-magnesium hydroxide (PEPCID COMPLETE) 10-800-165 MG chewable tablet Chew 1 tablet by mouth daily as needed (indigestion/heartburn).    . hydrocortisone valerate cream (WESTCORT) 0.2 % Apply 1 application topically 2 (two) times daily as needed. For suppurativa dermatitis  0  . L-METHYLFOLATE PO Take 5 mg by mouth daily.    . melatonin 5 MG TABS Take 5 mg by mouth at bedtime.    . Methylcobalamin 5000 MCG TBDP Take 5,000 mcg by mouth daily.    . Misc Natural Products  (OSTEO BI-FLEX ADV DOUBLE ST PO) Take 1 tablet by mouth 2 (two) times daily as needed (osteoarthritis pain.).    Marland Kitchen Misc Natural Products (PROGESTERONE EX) Apply 1 application topically every evening. Progesterone Cream 5% Compounded by [employer]    . Misc Natural Products (TART CHERRY ADVANCED) CAPS Take 1 capsule by mouth 2 (two) times daily as needed (osteoarthritis  pain.). W/Turmeric & Bromelain    . nitroGLYCERIN (NITROSTAT) 0.4 MG SL tablet Place 1 tablet (0.4 mg total) under the tongue every 5 (five) minutes as needed for up to 25 days for chest pain. (Patient taking differently: Place 0.4 mg under the tongue every 5 (five) minutes x 3 doses as needed for chest pain.) 25 tablet 3  . NONFORMULARY OR COMPOUNDED ITEM Apply 1 application topically every evening. BIEST CREAM 70-30 Compounded by [employer]    . pantoprazole (PROTONIX) 40 MG tablet Take 40 mg by mouth daily as needed (indigestion/heartburn.).    Marland Kitchen pyridOXINE (B-6) 50 MG tablet Take 50 mg by mouth daily. P5P 50    . rosuvastatin (CRESTOR) 20 MG tablet Take 1 tablet (20 mg total) by mouth daily. (Patient taking differently: Take 20 mg by mouth every evening.) 30 tablet 2  . sucralfate (CARAFATE) 1 g tablet Take 1 g by mouth at bedtime. Mix with water    . TESTOSTERONE NA Apply 1 application topically daily. Testosterone Topical Cream 2% Compounded by [employer]    . thyroid (ARMOUR) 60 MG tablet Take 60 mg by mouth daily before breakfast.    . VITAMIN K PO Take 90 mcg by mouth daily. MK-7     No facility-administered medications prior to visit.    Radiology:   No results found.  Cardiac Studies:   Left Heart Catheterization 02/24/20:  LV: Normal LV systolic function, EF 55 to 60%.  Normal LVEDP, no pressure gradient across the aortic valve. LM: Smooth and normal. LAD: Small, minimal luminal irregularity in the proximal segment, gives origin to small D1 and moderate-sized D2.  No high-grade stenosis  evident.  Slow flow was evident in the LAD. CX: Moderate sized vessel.  Again mild luminal irregularities evident in the midsegment. RCA: Dominant.  Mild luminal irregularities noted in the midsegment.  Again slow flow is evident.  Intracoronary nitroglycerin administration: Marked improvement in flow, brisk flow noted in the LAD, circumflex and right coronary artery post nitroglycerin administration.  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  We will discontinue metoprolol and switch her to amlodipine 2.5 mg daily, continue using nitroglycerin as needed, continue with high intensity statins for now.  Expect good response.  I have reassured the patient.  60 mL contrast utilized.   EKG:    EKG 02/17/2020: Normal sinus rhythm at rate of 61 bpm, normal axis, no evidence of ischemia, normal EKG.     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 No orders of the defined types were placed in this encounter.  Recommendations:   Gail Henderson is a 60 y.o. Caucasian female with no significant prior cardiovascular history, no premature coronary artery disease in family for CAD, non-smoker and nondiabetic referred on urgent basis by Dr. 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  Patient was initially set up for coronary CTA however due to persistent symptoms of chest pain suggestive of angina, she underwent cardiac catheterization on 02/24/2020 which revealed normal coronary arteries but very slow flow which improved with intracoronary nitroglycerin administration.  She was diagnosed with microvascular angina.  She now presents here for follow-up.  I reviewed the angiogram with the patient,  showed her  the images of improved flow post nitroglycerin administration.  Continue low-dose amlodipine for now, high intensity statins for now, will obtain lipid profile in about 4 to 6 weeks.  We will start her on Ranexa, side effects of Ranexa discussed in detail.  I would like to see her back in 3 months for follow-up.  Encouraged her to use nitroglycerin as felt appropriate, she is also using arginine supplement.  She is requesting a letter to restrict her activity for 6 to 8 hours a day, this will be sent to her.   Adrian Prows, MD, [facility] 03/17/2020, 11:21 AM Office: [phone]

## 2020-03-17 NOTE — Telephone Encounter (Signed)
done

## 2020-03-18 ENCOUNTER — Encounter: Payer: Self-pay | Admitting: Cardiology

## 2020-03-22 ENCOUNTER — Other Ambulatory Visit: Payer: 59

## 2020-03-23 ENCOUNTER — Other Ambulatory Visit: Payer: 59

## 2020-03-30 ENCOUNTER — Other Ambulatory Visit: Payer: Self-pay

## 2020-03-30 ENCOUNTER — Ambulatory Visit: Payer: 59

## 2020-03-30 DIAGNOSIS — I208 Other forms of angina pectoris: Secondary | ICD-10-CM

## 2020-03-30 DIAGNOSIS — R9431 Abnormal electrocardiogram [ECG] [EKG]: Secondary | ICD-10-CM

## 2020-04-05 NOTE — Progress Notes (Signed)
Normal echo

## 2020-04-14 LAB — LIPID PANEL WITH LDL/HDL RATIO
Cholesterol, Total: 142 mg/dL (ref 100–199)
HDL: 61 mg/dL (ref >39)
LDL Chol Calc (NIH): 63 mg/dL (ref 0–99)
LDL/HDL Ratio: 1 ratio (ref 0.0–3.2)
Triglycerides: 97 mg/dL (ref 0–149)
VLDL Cholesterol Cal: 18 mg/dL (ref 5–40)

## 2020-04-14 NOTE — Progress Notes (Signed)
Excellent lipid control

## 2020-04-18 DIAGNOSIS — R82998 Other abnormal findings in urine: Secondary | ICD-10-CM

## 2020-04-18 DIAGNOSIS — E7800 Pure hypercholesterolemia, unspecified: Secondary | ICD-10-CM

## 2020-04-19 NOTE — Telephone Encounter (Signed)
ICD-10-CM   1. Dark urine  R82.998 CK    Urinalysis, Routine w reflex microscopic  2. Hypercholesteremia  E78.00 CK    Orders Placed This Encounter  Procedures  . CK    Standing Status:   Future    Standing Expiration Date:   05/20/2020  . Urinalysis, Routine w reflex microscopic    Standing Status:   Future    Standing Expiration Date:   05/20/2020     [name], MD, [facility] 04/19/2020, 11:19 PM Office: [phone] Pager: [phone]

## 2020-04-22 ENCOUNTER — Other Ambulatory Visit: Payer: Self-pay | Admitting: Cardiology

## 2020-04-23 LAB — URINALYSIS, ROUTINE W REFLEX MICROSCOPIC
Bilirubin, UA: NEGATIVE
Glucose, UA: NEGATIVE
Ketones, UA: NEGATIVE
Nitrite, UA: NEGATIVE
Protein,UA: NEGATIVE
RBC, UA: NEGATIVE
Specific Gravity, UA: 1.009 (ref 1.005–1.030)
Urobilinogen, Ur: 0.2 mg/dL (ref 0.2–1.0)
pH, UA: 5.5 (ref 5.0–7.5)

## 2020-04-23 LAB — MICROSCOPIC EXAMINATION
Bacteria, UA: NONE SEEN
Casts: NONE SEEN /lpf
RBC, Urine: NONE SEEN /hpf (ref 0–2)

## 2020-04-23 LAB — CK: Total CK: 76 U/L (ref 32–182)

## 2020-05-15 ENCOUNTER — Other Ambulatory Visit: Payer: Self-pay | Admitting: Cardiology

## 2020-05-15 DIAGNOSIS — E7800 Pure hypercholesterolemia, unspecified: Secondary | ICD-10-CM

## 2020-06-21 ENCOUNTER — Other Ambulatory Visit: Payer: Self-pay | Admitting: Cardiology

## 2020-06-21 DIAGNOSIS — E7800 Pure hypercholesterolemia, unspecified: Secondary | ICD-10-CM

## 2020-06-22 NOTE — Telephone Encounter (Signed)
Refill request

## 2020-07-20 ENCOUNTER — Other Ambulatory Visit: Payer: Self-pay | Admitting: Cardiology

## 2020-07-20 DIAGNOSIS — I208 Other forms of angina pectoris: Secondary | ICD-10-CM

## 2020-08-16 ENCOUNTER — Other Ambulatory Visit: Payer: Self-pay | Admitting: Cardiology

## 2020-08-16 DIAGNOSIS — I208 Other forms of angina pectoris: Secondary | ICD-10-CM

## 2020-08-19 NOTE — Telephone Encounter (Signed)
From pt

## 2020-09-15 ENCOUNTER — Ambulatory Visit: Payer: 59 | Admitting: Cardiology

## 2020-09-15 ENCOUNTER — Other Ambulatory Visit: Payer: Self-pay

## 2020-09-15 ENCOUNTER — Encounter: Payer: Self-pay | Admitting: Cardiology

## 2020-09-15 VITALS — BP 117/77 | HR 70 | Temp 97.7°F | Resp 16 | Ht 64.0 in | Wt 162.8 lb

## 2020-09-15 DIAGNOSIS — E7800 Pure hypercholesterolemia, unspecified: Secondary | ICD-10-CM

## 2020-09-15 DIAGNOSIS — R400 Somnolence: Secondary | ICD-10-CM

## 2020-09-15 DIAGNOSIS — R0683 Snoring: Secondary | ICD-10-CM

## 2020-09-15 DIAGNOSIS — I209 Angina pectoris, unspecified: Secondary | ICD-10-CM

## 2020-09-15 NOTE — Progress Notes (Signed)
Primary Physician/Referring:  Audley Hose, MD  Patient ID: Gail Henderson, female    DOB: 09/18/1960, 60 y.o.   MRN: [id number]  Chief Complaint  Patient presents with   Chest Pain   Hyperlipidemia    HPI:    Gail Henderson  is a 60 y.o. Caucasian female with no significant prior cardiovascular history, no premature coronary artery disease in family for CAD, non-smoker and nondiabetic referred on urgent basis by Dr. 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  Cardiac catheterization on 02/24/2020 which revealed normal coronary arteries but very slow flow which improved with intracoronary nitroglycerin administration.  She was diagnosed with microvascular angina.  This is her annual visit.  She is tolerating all her medications well, still uses about approximately 1 sublingual nitroglycerin on a daily basis.  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  Hence she is trying to apply for disability from her job.   Past Medical History:  Diagnosis Date   Fatty liver    showed up on the gallbladder ultrasound; liver enzymes normal   Gallbladder polyp    Gallstone    Hypothyroidism    PONV (postoperative nausea and vomiting)    Past Surgical History:  Procedure Laterality Date   ABDOMINAL HYSTERECTOMY  2009   [facility]   CHOLECYSTECTOMY N/A 08/16/2017   Procedure: LAPAROSCOPIC CHOLECYSTECTOMY;  Surgeon: Coralie Keens, MD;  Location: [facility];  Service: General;  Laterality: N/A;   COLONOSCOPY  2013   DIAGNOSTIC LAPAROSCOPY  1992   ovarian cyst right   LEFT HEART CATH AND CORONARY ANGIOGRAPHY N/A 02/24/2020   Procedure: LEFT HEART CATH AND CORONARY ANGIOGRAPHY;  Surgeon: Adrian Prows, MD;  Location: [facility];  Service: Cardiovascular;  Laterality: N/A;   Family History  Problem Relation Age of Onset   Heart attack Mother    Hypertension Sister    Diabetes Brother     Social History   Tobacco Use   Smoking status: Never   Smokeless tobacco: Never  Substance Use Topics   Alcohol use: Yes    Alcohol/week: 1.0 standard drink    Types: 1 Glasses of wine per week   Marital Status: Married  ROS  Review of Systems  Constitutional: Positive for malaise/fatigue and weight gain.  Cardiovascular:  Positive for chest pain. Negative for dyspnea on exertion, leg swelling and palpitations.  Gastrointestinal:  Negative for melena.  Objective  Blood pressure 117/77, pulse 70, temperature 97.7 F (36.5 C), temperature source Temporal, resp. rate 16, height 5' 4"  (1.626 m), weight 162 lb 12.8 oz (73.8 kg), SpO2 97 %.  Vitals with BMI 09/15/2020 09/15/2020 03/17/2020  Height 5' 4"  5' 4"  5' 4"   Weight 162 lbs 13 oz 162 lbs 13 oz 159 lbs  BMI 27.93 95.62 13.08  Systolic 657 846 962  Diastolic 77 77 77  Pulse 70 70 67     Physical Exam Cardiovascular:     Rate and Rhythm: Normal rate and regular rhythm.     Pulses: Intact distal pulses.     Heart sounds: Normal heart sounds. No murmur heard.   No gallop.     Comments: No leg  edema, no JVD. Pulmonary:     Effort: Pulmonary effort is normal.     Breath sounds: Normal breath sounds.  Abdominal:     General: Bowel sounds are normal.     Palpations: Abdomen is soft.   Laboratory examination:   Recent Labs    02/17/20 1400  NA 142  K 4.2  CL 102  CO2 24  GLUCOSE 79  BUN 15  CREATININE 0.80  CALCIUM 10.3  GFRNONAA 80  GFRAA 93   CrCl cannot be calculated (Patient's most recent lab result is older than the maximum 21 days allowed.).  CMP Latest Ref Rng & Units 02/17/2020  Glucose 65 - 99 mg/dL 79  BUN 8 - 27 mg/dL 15  Creatinine 0.57 - 1.00 mg/dL 0.80  Sodium 134 - 144 mmol/L 142  Potassium 3.5 -  5.2 mmol/L 4.2  Chloride 96 - 106 mmol/L 102  CO2 20 - 29 mmol/L 24  Calcium 8.7 - 10.3 mg/dL 10.3  Total Protein 6.0 - 8.5 g/dL 7.6  Total Bilirubin 0.0 - 1.2 mg/dL 0.5  Alkaline Phos 44 - 121 IU/L 77  AST 0 - 40 IU/L 24  ALT 0 - 32 IU/L 41(H)   CBC Latest Ref Rng & Units 02/24/2020 08/14/2017  WBC 4.0 - 10.5 K/uL 6.9 8.1  Hemoglobin 12.0 - 15.0 g/dL 14.3 14.7  Hematocrit 36.0 - 46.0 % 41.5 43.3  Platelets 150 - 400 K/uL 351 355    Lipid Panel Recent Labs    02/17/20 1400 04/13/20 0934  CHOL 239* 142  TRIG 120 97  LDLCALC 155* 63  HDL 63 61  LDLDIRECT 157*  --     HEMOGLOBIN A1C No results found for: HGBA1C, MPG TSH No results for input(s): TSH in the last 8760 hours.  External labs:   Labs 08/01/2018:  Hb 14.5/HCT 43.3, platelets 379.  Serum glucose 1 3 mg, BUN 13, creatinine 0.83, EGFR >78 mL.  Potassium 4.4.  CMP otherwise normal.  Total cholesterol 219, triglycerides 134, HDL 67, LDL 125.  A1c 5.4%.  TSH normal at 2.220.  C-reactive protein 0.82.  Medications and allergies   Allergies  Allergen Reactions   Milk Protein     eczema   Wheat Bran     Migraine headaches   Other Rash    TEGADERM FILM--rash/irritated skin     Outpatient Medications Prior to Visit  Medication Sig Dispense Refill   amLODipine (NORVASC) 2.5 MG tablet Take 1 tablet by mouth once daily 30 tablet 3   Ascorbic Acid (VITAMIN C) 1000 MG tablet Take 1,000 mg by mouth daily.     Cholecalciferol (VITAMIN D3) 2000 units TABS Take 2,000 Units by mouth daily.     Coenzyme Q10 300 MG CAPS Take 1 tablet by mouth daily.     cyanocobalamin 1000 MCG tablet Take by mouth daily.     hydrocortisone valerate cream (WESTCORT) 0.2 % Apply 1 application topically 2 (two) times daily as needed. For suppurativa dermatitis  0   L-METHYLFOLATE PO Take 5 mg by mouth daily.     melatonin 5 MG TABS Take 5 mg by mouth at bedtime.     Methylcobalamin 5000 MCG TBDP Take 5,000 mcg by mouth daily.     Misc  Natural Products (PROGESTERONE EX) Apply 1 application topically every evening. Progesterone Cream 5% Compounded by [facility]     nitroGLYCERIN (NITROSTAT) 0.4 MG SL tablet Place 1 tablet (0.4 mg total) under the tongue every 5 (five) minutes as  needed for up to 25 days for chest pain. (Patient taking differently: Place 0.4 mg under the tongue every 5 (five) minutes x 3 doses as needed for chest pain.) 25 tablet 3   NONFORMULARY OR COMPOUNDED ITEM Apply 1 application topically every evening. BIEST CREAM 70-30 Compounded by [facility]     pantoprazole (PROTONIX) 40 MG tablet Take 40 mg by mouth daily as needed (indigestion/heartburn.).     Probiotic Product (MISC INTESTINAL FLORA REGULAT) CAPS Take 1 capsule by mouth daily as needed.     pyridOXINE (B-6) 50 MG tablet Take 50 mg by mouth daily. P5P 50     rosuvastatin (CRESTOR) 20 MG tablet Take 1 tablet by mouth once daily 30 tablet 3   sucralfate (CARAFATE) 1 g tablet Take 1 g by mouth at bedtime. Mix with water     TESTOSTERONE NA Apply 1 application topically daily. Testosterone Topical Cream 2% Compounded by [facility]     VITAMIN K PO Take 90 mcg by mouth daily. MK-7     ALPRAZolam (XANAX) 0.5 MG tablet Take by mouth as needed.     docusate sodium (COLACE) 100 MG capsule Take 100 mg by mouth 2 (two) times daily as needed for mild constipation.     famotidine-calcium carbonate-magnesium hydroxide (PEPCID COMPLETE) 10-800-165 MG chewable tablet Chew 1 tablet by mouth daily as needed (indigestion/heartburn).     folic acid (FOLVITE) 1 MG tablet Take by mouth daily.     Misc Natural Products (OSTEO BI-FLEX ADV DOUBLE ST PO) Take 1 tablet by mouth 2 (two) times daily as needed (osteoarthritis pain.).     ranolazine (RANEXA) 1000 MG SR tablet TAKE 1  BY MOUTH TWICE DAILY 60 tablet 0   No facility-administered medications prior to visit.    Radiology:   No results found.  Cardiac Studies:   Left Heart  Catheterization 02/24/20:  LV: Normal LV systolic function, EF 55 to 60%.  Normal LVEDP, no pressure gradient across the aortic valve. LM: Smooth and normal. LAD: Small, minimal luminal irregularity in the proximal segment, gives origin to small D1 and moderate-sized D2.  No high-grade stenosis evident.  Slow flow was evident in the LAD. CX: Moderate sized vessel.  Again mild luminal irregularities evident in the midsegment. RCA: Dominant.  Mild luminal irregularities noted in the midsegment.  Again slow flow is evident.   Intracoronary nitroglycerin administration: Marked improvement in flow, brisk flow noted in the LAD, circumflex and right coronary artery post nitroglycerin administration.   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  We will discontinue metoprolol and switch her to amlodipine 2.5 mg daily, continue using nitroglycerin as needed, continue with high intensity statins for now.  Expect good response.  I have reassured the patient.  60 mL contrast utilized.   EKG:   EKG 09/15/2020: Normal sinus rhythm at rate of 60 bpm, left atrial enlargement, otherwise normal EKG.  Compared to 02/17/2020, left atrial enlargement is new.  Assessment     ICD-10-CM   1. Angina pectoris ([place])  I20.9 EKG 12-Lead    Ambulatory referral to Sleep Studies    2. Hypercholesteremia  E78.00     3. Daytime somnolence  R40.0 Ambulatory referral to Sleep Studies    4. Snoring  R06.83 Ambulatory referral to Sleep Studies      No orders of the defined types were placed in this encounter. Orders Placed This Encounter  Procedures   Ambulatory referral to Sleep Studies  Referral Priority:   Routine    Referral Type:   Consultation    Referral Reason:   Specialty Services Required    Referred to Provider:   Dohmeier, Asencion Partridge, MD    Number of Visits Requested:   1   EKG 12-Lead    Recommendations:   Gail Henderson is a 60 y.o. Caucasian  female with no significant prior cardiovascular history, no premature coronary artery disease in family for CAD, non-smoker and nondiabetic referred on urgent basis by Dr. 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  Cardiac catheterization on 02/24/2020 which revealed normal coronary arteries but very slow flow which improved with intracoronary nitroglycerin administration.  She was diagnosed with microvascular angina.  This is her annual visit.  She is tolerating all her medications well, still uses about approximately 1 sublingual nitroglycerin on a daily basis.  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  Hence she is trying to apply for disability from her job.  He continues to complain of marked fatigue, states that at around 2 to 3:00 in the afternoon she is completely worn out.  She does have mild snoring by history as well.  I would like to evaluate for obstructive and also central sleep apnea, referral was made to Dr. Asencion Partridge Dohmeier.  This may also explain her anginal symptoms as well if present.  Otherwise she is doing well, EKG is normal, lipids are well controlled.  I will continue to see her back on an annual basis.     Adrian Prows, MD, [facility] 09/15/2020, 11:27 AM Office: [phone]

## 2020-09-30 ENCOUNTER — Other Ambulatory Visit: Payer: Self-pay | Admitting: Cardiology

## 2020-09-30 DIAGNOSIS — I208 Other forms of angina pectoris: Secondary | ICD-10-CM

## 2020-10-18 ENCOUNTER — Other Ambulatory Visit: Payer: Self-pay | Admitting: Cardiology

## 2020-10-18 DIAGNOSIS — E7800 Pure hypercholesterolemia, unspecified: Secondary | ICD-10-CM

## 2020-10-18 DIAGNOSIS — I208 Other forms of angina pectoris: Secondary | ICD-10-CM

## 2020-10-18 NOTE — Telephone Encounter (Signed)
From patient.

## 2020-10-19 ENCOUNTER — Other Ambulatory Visit: Payer: Self-pay | Admitting: Cardiology

## 2020-10-19 DIAGNOSIS — I208 Other forms of angina pectoris: Secondary | ICD-10-CM

## 2020-10-19 DIAGNOSIS — I209 Angina pectoris, unspecified: Secondary | ICD-10-CM

## 2020-10-19 MED ORDER — RANOLAZINE ER 1000 MG PO TB12: 1000.0000 mg | ORAL_TABLET | Freq: Two times a day (BID) | ORAL | 3 refills | Status: DC

## 2020-10-19 NOTE — Telephone Encounter (Signed)
From patient.

## 2020-10-29 ENCOUNTER — Other Ambulatory Visit: Payer: Self-pay | Admitting: Cardiology

## 2020-10-29 DIAGNOSIS — I208 Other forms of angina pectoris: Secondary | ICD-10-CM

## 2020-11-09 ENCOUNTER — Other Ambulatory Visit: Payer: Self-pay | Admitting: Cardiology

## 2020-11-09 DIAGNOSIS — I208 Other forms of angina pectoris: Secondary | ICD-10-CM

## 2020-11-15 MED ORDER — ISOSORBIDE DINITRATE 30 MG PO TABS: 30.0000 mg | ORAL_TABLET | Freq: Two times a day (BID) | ORAL | 2 refills | Status: DC

## 2020-11-15 NOTE — Telephone Encounter (Signed)
From patient.

## 2020-11-23 ENCOUNTER — Other Ambulatory Visit: Payer: Self-pay | Admitting: Cardiology

## 2020-11-23 DIAGNOSIS — E7800 Pure hypercholesterolemia, unspecified: Secondary | ICD-10-CM

## 2020-11-28 ENCOUNTER — Other Ambulatory Visit: Payer: Self-pay | Admitting: Cardiology

## 2020-11-28 DIAGNOSIS — I208 Other forms of angina pectoris: Secondary | ICD-10-CM

## 2020-12-01 ENCOUNTER — Ambulatory Visit: Payer: 59 | Admitting: Neurology

## 2020-12-01 ENCOUNTER — Encounter: Payer: Self-pay | Admitting: Neurology

## 2020-12-01 VITALS — BP 120/75 | HR 62 | Ht 64.0 in | Wt 165.0 lb

## 2020-12-01 DIAGNOSIS — I201 Angina pectoris with documented spasm: Secondary | ICD-10-CM

## 2020-12-01 DIAGNOSIS — I517 Cardiomegaly: Secondary | ICD-10-CM | POA: Diagnosis not present

## 2020-12-01 DIAGNOSIS — M2619 Other specified anomalies of jaw-cranial base relationship: Secondary | ICD-10-CM | POA: Insufficient documentation

## 2020-12-01 NOTE — Patient Instructions (Signed)
Fatigue If you have fatigue, you feel tired all the time and have a lack of energy or a lack of motivation. Fatigue may make it difficult to start or complete tasks because of exhaustion. In general, occasional or mild fatigue is often a normal response to activity or life. However, long-lasting (chronic) or extreme fatigue may be a symptom of a medical condition. Follow these instructions at home: General instructions Watch your fatigue for any changes. Go to bed and get up at the same time every day. Avoid fatigue by pacing yourself during the day and getting enough sleep at night. Maintain a healthy weight. Medicines Take over-the-counter and prescription medicines only as told by your health care provider. Take a multivitamin, if told by your health care provider.  Do not use herbal or dietary supplements unless they are approved by your health care provider. Activity  Exercise regularly, as told by your health care provider. Use or practice techniques to help you relax, such as yoga, tai chi, meditation, or massage therapy. Eating and drinking  Avoid heavy meals in the evening. Eat a well-balanced diet, which includes lean proteins, whole grains, plenty of fruits and vegetables, and low-fat dairy products. Avoid consuming too much caffeine. Avoid the use of alcohol. Drink enough fluid to keep your urine pale yellow. Lifestyle Change situations that cause you stress. Try to keep your work and personal schedule in balance. Do not use any products that contain nicotine or tobacco, such as cigarettes and e-cigarettes. If you need help quitting, ask your health care provider. Do not use drugs. Contact a health care provider if: Your fatigue does not get better. You have a fever. You suddenly lose or gain weight. You have headaches. You have trouble falling asleep or sleeping through the night. You feel angry, guilty, anxious, or sad. You are unable to have a bowel movement  (constipation). Your skin is dry. You have swelling in your legs or another part of your body. Get help right away if: You feel confused. Your vision is blurry. You feel faint or you pass out. You have a severe headache. You have severe pain in your abdomen, your back, or the area between your waist and hips (pelvis). You have chest pain, shortness of breath, or an irregular or fast heartbeat. You are unable to urinate, or you urinate less than normal. You have abnormal bleeding, such as bleeding from the rectum, vagina, nose, lungs, or nipples. You vomit blood. You have thoughts about hurting yourself or others. If you ever feel like you may hurt yourself or others, or have thoughts about taking your own life, get help right away. You can go to your nearest emergency department or call: Your local emergency services (911 in the [place] A suicide crisis helpline, such as the [facility] at 678-192-8000 or 988 in the [place]. This is open 24 hours a day. Summary If you have fatigue, you feel tired all the time and have a lack of energy or a lack of motivation. Fatigue may make it difficult to start or complete tasks because of exhaustion. Long-lasting (chronic) or extreme fatigue may be a symptom of a medical condition. Exercise regularly, as told by your health care provider. Change situations that cause you stress. Try to keep your work and personal schedule in balance. This information is not intended to replace advice given to you by your health care provider. Make sure you discuss any questions you have with your health care provider. Document Revised:  08/04/2020 Document Reviewed: 11/20/2019 Elsevier Patient Education  2022 [place] for Sleep Apnea Sleep apnea is a condition in which breathing pauses or becomes shallow during sleep. Sleep apnea screening is a test to determine if you are at risk for sleep apnea. The test includes a series of  questions. It will only takes a few minutes. Your health care provider may ask you to have this test in preparation for surgery or as part of a physical exam. What are the symptoms of sleep apnea? Common symptoms of sleep apnea include: Snoring. Waking up often at night. Daytime sleepiness. Pauses in breathing. Choking or gasping during sleep. Irritability. Forgetfulness. Trouble thinking clearly. Depression. Personality changes. Most people with sleep apnea do not know that they have it. What are the advantages of sleep apnea screening? Getting screened for sleep apnea can help: Ensure your safety. It is important for your health care providers to know whether or not you have sleep apnea, especially if you are having surgery or have other long-term (chronic) health conditions. Improve your health and allow you to get a better night's rest. Restful sleep can help you: Have more energy. Lose weight. Improve high blood pressure. Improve diabetes management. Prevent stroke. Prevent car accidents. What happens during the screening? Screening usually includes being asked a list of questions about your sleep quality. Some questions you may be asked include: Do you snore? Is your sleep restless? Do you have daytime sleepiness? Has a partner or spouse told you that you stop breathing during sleep? Have you had trouble concentrating or memory loss? What is your age? What is your neck circumference? To measure your neck, keep your back straight and gently wrap the tape measure around your neck. Put the tape measure at the middle of your neck, between your chin and collarbone. What is your sex assigned at birth? Do you have or are you being treated for high blood pressure? If your screening test is positive, you are at risk for the condition. Further testing may be needed to confirm a diagnosis of sleep apnea. Where to find more information You can find screening tools online or at your  health care clinic. For more information about sleep apnea screening and healthy sleep, visit these websites: Centers for Disease Control and Prevention: http://www.wolf.info/ American Sleep Apnea Association: www.sleepapnea.org Contact a health care provider if: You think that you may have sleep apnea. Summary Sleep apnea screening can help determine if you are at risk for sleep apnea. It is important for your health care providers to know whether or not you have sleep apnea, especially if you are having surgery or have other chronic health conditions. You may be asked to take a screening test for sleep apnea in preparation for surgery or as part of a physical exam. This information is not intended to replace advice given to you by your health care provider. Make sure you discuss any questions you have with your health care provider. Document Revised: 12/19/2019 Document Reviewed: 12/19/2019 Elsevier Patient Education  2022 [place]. Angina Angina is discomfort or pain in the chest, neck, arm, jaw, or back. The discomfort is caused by a lack of blood in the middle layer of the heart wall. The middle layer of the heart wall is called the myocardium. What are the causes? This condition is caused by a buildup of fat and cholesterol, or plaque, in your arteries. This buildup narrows the arteries and makes it hard for blood to flow. What increases the  risk? Main risks High levels of cholesterol in your blood. High blood pressure. Diabetes. Family history of heart disease. Not exercising or moving enough. Depression. Having had radiation treatment on the left side of your chest. Other risks Tobacco use. Too much body weight (obesity). A diet that is high in unhealthy fats (saturated fats). Stress. Using drugs, such as cocaine. Risks for women Being older than 55 years. Being in menopause. This is the time when a woman no longer has a menstrual period. What are the signs or symptoms? Symptoms  in all people Chest pain, which may: Feel like a crushing or squeezing in the chest. Feel like a tightness, pressure, fullness, or heaviness in the chest. Last for more than a few minutes at a time. Stop and come back (recur) after a few minutes. Pain in the neck, arm, jaw, or back. Heartburn or upset stomach (indigestion) for no reason. Being short of breath. Feeling like you may vomit (nauseous). Sudden cold sweats. Symptoms in women and people with diabetes Tiredness. Worry and anxiety. Weakness. Dizziness or fainting. How is this treated? This condition may be treated with: Medicines. These can be given to prevent blood clots, stop a heart attack, lower blood pressure, or treat other risk factors. A procedure to widen a narrowed or blocked artery in the heart. Surgery to allow blood to go around a blocked artery. Follow these instructions at home: Medicines Take over-the-counter and prescription medicines only as told by your doctor. Do not take these medicines unless your doctor says that you can: NSAIDs. These include: Ibuprofen. Naproxen. Vitamin supplements that have vitamin A, vitamin E, or both. Hormone therapy that contains estrogen with or without progestin. Eating and drinking  Eat a heart-healthy diet that includes: Lots of fresh fruits and vegetables. Whole grains. Low-fat (lean) protein. Low-fat dairy products. Follow instructions from your doctor about what you cannot eat or drink. Activity Follow an exercise program that your doctor tells you. Talk with your doctor about joining a program to make your heart strong again (cardiac rehab). When you feel tired, take a break. Plan breaks if you know you are going to feel tired. Lifestyle  Do not smoke or use any products that contain nicotine or tobacco. If you need help quitting, ask your doctor. If your doctor says you can drink alcohol: Limit how much you have to: 0-1 drink a day for women who are not  pregnant. 0-2 drinks a day for men. Know how much alcohol is in your drink. In the [place] one drink equals one 12 oz bottle of beer (355 mL), one 5 oz glass of wine (148 mL), or one 1 oz glass of hard liquor (44 mL). General instructions Stay at a healthy weight. If told to lose weight, work with your doctor to do lose weight safely. Learn to manage stress. If you need help, ask your doctor. Keep your vaccines up to date. Get a flu shot every year. Talk with your doctor if you feel sad. Take a screening test to see if you are at risk for depression. Work with your doctor to manage any other health problems that you have. These may include diabetes or high blood pressure. Keep all follow-up visits. Get help right away if: You have pain in your chest, neck, arm, jaw, or back, and the pain: Lasts more than a few minutes. Comes back. Does not get better after you take medicine under your tongue (sublingual nitroglycerin). Keeps getting worse. Comes more often. You have  any of these problems: Sweating a lot. Heartburn or upset stomach. Shortness of breath. Trouble breathing. Feeling like you may vomit. Vomiting. Feeling more tired than normal. Feeling nervous or worrying more than normal. Weakness. You are dizzy or light-headed all of a sudden. You faint. These symptoms may be an emergency. Get help right away. Call your local emergency services (911 in the [place] Do not wait to see if the symptoms will go away. Do not drive yourself to the hospital. Summary Angina is discomfort or pain in the chest, neck, arm, neck, or back. Symptoms include chest pain, heartburn or upset stomach, and shortness of breath. Women or people with diabetes may have other symptoms, such as feeling nervous, being worried, or being weak or tired. Take all medicines only as told by your doctor. You should eat a heart-healthy diet and follow an exercise program. This information is not intended to replace  advice given to you by your health care provider. Make sure you discuss any questions you have with your health care provider. Document Revised: 07/04/2019 Document Reviewed: 07/04/2019 Elsevier Patient Education  [place].

## 2020-12-01 NOTE — Progress Notes (Signed)
SLEEP MEDICINE CLINIC    Provider:  Melvyn Novas, MD  Primary Care Physician:  Harvest Forest, MD [address] [place] [address]     Referring Provider: [name], MD [facility]  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  Chief concern according to patient :  patient with Prinzmetal angina, used  to take 4-5 times a day nitrate, now not as often having chest pains. She is on Isosorbide mononitrate. She is not working until she has this evaluated. She d feels weaker overall.  Internal referral  by Dr. Jacinto Halim, for daytime somnolence and snoring. No prior PSG, formerly on adderall and caffeine-  Pt referred by Dr. 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 Cardiology wanting to see if pt could have central sleep apnea. Pt sleeps on side to reduce snoring.    I have the pleasure of seeing Gail Henderson today, a right-handed Caucasian female with a possible sleep disorder.   She has a past medical history of Fatty liver, Gallbladder polyp, Gallstone, GERD, Hypothyroidism, Angina, and Hysterectomy at age 25 , prinzmetal angina, menopausal symptoms, now on HRT.  Left  atrial enlargement. Fatigue since menopause. PONV (postoperative nausea and vomiting).   Sleep relevant medical history: Nocturia 1-2 , Sleep walking in childhood, nasal septal deviation. Overbite.  Family medical /sleep history: no other family member on CPAP with OSA.    Social history:  Patient is working as NA and currently on medical leave-  and lives in a household with spouse, 1 dog.  One son in [place], in the [place], grandchildren.  Tobacco  use: none .  ETOH use ; 5-7 a week,  causes GERD- Caffeine intake in form of Coffee( /) Soda( /) Tea ( /) or energy drinks. Regular exercise I; since she has been on medical leave- walking 4 miles.    Sleep habits are as follows: The patient's dinner time is between 6 pm  PM. The patient goes to bed at 10 PM and has trouble to fall asleep-takes 2.5 mg Ambien finally, continues to sleep for 5-7 hours, wakes for 1-2 bathroom breaks.   The preferred sleep position is supine, on the sides , with the support of a wedge pillows.  Dreams are reportedly frequent/vivid.  Now, 7  AM is the usual rise time. The patient wakes up spontaneously.  She reports not feeling refreshed or restored in AM, with symptoms such as dry mouth, nasal congestion, and residual fatigue.  Naps are taken infrequently.    Review of Systems: Out of a complete 14 system review, the patient complains of only the following symptoms, and all other reviewed systems are negative.:  Fatigue, sleepiness , snoring, fragmented sleep, nocturia. Restless due to GERD, choking when asleep in supine,  delayed sleep onset since hysterectomy.   Chest pains !   How likely are you to doze in the following situations: 0 = not likely, 1 = slight chance, 2 = moderate chance, 3 = high chance   Sitting and  Reading? Watching Television? Sitting inactive in a public place (theater or meeting)? As a passenger in a car for an hour without a break? Lying down in the afternoon when circumstances permit? Sitting and talking to someone? Sitting quietly after lunch without alcohol? In a car, while stopped for a few minutes in traffic?   Total = 4/ 24 points   FSS endorsed at 60/ 63 points.   Social History   Socioeconomic History   Marital status: Married    Spouse name: Gail Henderson   Number of children: 1   Years of education: Not on file   Highest education level: Bachelor's degree (e.g., BA, AB, BS)  Occupational History   Not on file   Tobacco Use   Smoking status: Never   Smokeless tobacco: Never  Vaping Use   Vaping Use: Never used  Substance and Sexual Activity   Alcohol use: Yes    Alcohol/week: 1.0 standard drink    Types: 1 Glasses of wine per week   Drug use: Never   Sexual activity: Not on file  Other Topics Concern   Not on file  Social History Narrative   Lives with husband   Right handed   Caffeine: pt quite drinking caffeine in Jan. 2022   Social Determinants of Health   Financial Resource Strain: Not on file  Food Insecurity: Not on file  Transportation Needs: Not on file  Physical Activity: Not on file  Stress: Not on file  Social Connections: Not on file    Family History  Problem Relation Age of Onset   Heart attack Mother    Hypertension Sister    Diabetes Brother     Past Medical History:  Diagnosis Date   Fatty liver    showed up on the gallbladder ultrasound; liver enzymes normal   Gallbladder polyp    Gallstone    Hypothyroidism    PONV (postoperative nausea and vomiting)     Past Surgical History:  Procedure Laterality Date   ABDOMINAL HYSTERECTOMY  2009   BSO   CESAREAN SECTION  1999   CHOLECYSTECTOMY N/A 08/16/2017   Procedure: LAPAROSCOPIC CHOLECYSTECTOMY;  Surgeon: Abigail Miyamoto, MD;  Location: [facility] ORS;  Service: General;  Laterality: N/A;   COLONOSCOPY  2013   DIAGNOSTIC LAPAROSCOPY  1992   ovarian cyst right   LEFT HEART CATH AND CORONARY ANGIOGRAPHY N/A 02/24/2020   Procedure: LEFT HEART CATH AND CORONARY ANGIOGRAPHY;  Surgeon: [name], MD;  Location: [facility] INVASIVE CV LAB;  Service: Cardiovascular;  Laterality: N/A;     Current Outpatient Medications on File Prior to Visit  Medication Sig Dispense Refill   amLODipine (NORVASC) 2.5 MG tablet Take 1 tablet by mouth once daily 30 tablet 0   Ascorbic Acid (VITAMIN C) 1000 MG tablet Take 1,000 mg by mouth daily.     Cholecalciferol (VITAMIN D3) 2000 units TABS Take 2,000 Units by mouth daily.     Coenzyme  Q10 300 MG CAPS Take 1 tablet by mouth daily.     cyanocobalamin 1000 MCG tablet Take by mouth daily.     hydrocortisone valerate cream (WESTCORT) 0.2 % Apply 1 application topically 2 (two) times daily as needed. For suppurativa dermatitis  0   isosorbide dinitrate (ISORDIL) 30 MG tablet Take 1 tablet (30 mg total) by mouth 2 (two) times daily. 60 tablet 2   L-METHYLFOLATE PO Take 5 mg by mouth daily.     melatonin 5 MG TABS Take 5 mg by mouth at bedtime.  Methylcobalamin 5000 MCG TBDP Take 5,000 mcg by mouth daily.     Misc Natural Products (PROGESTERONE EX) Apply 1 application topically every evening. Progesterone Cream 5% Compounded by [facility]     nitroGLYCERIN (NITROSTAT) 0.4 MG SL tablet DISSOLVE ONE TABLET UNDER THE TONGUE EVERY 5 MINUTES AS NEEDED FOR CHEST PAIN.  DO NOT EXCEED A TOTAL OF 3 DOSES IN 15 MINUTES 25 tablet 0   NONFORMULARY OR COMPOUNDED ITEM Apply 1 application topically every evening. BIEST CREAM 70-30 Compounded by [facility]     pantoprazole (PROTONIX) 40 MG tablet Take 40 mg by mouth daily as needed (indigestion/heartburn.).     Probiotic Product (MISC INTESTINAL FLORA REGULAT) CAPS Take 1 capsule by mouth daily as needed.     pyridOXINE (B-6) 50 MG tablet Take 50 mg by mouth daily. P5P 50     rosuvastatin (CRESTOR) 20 MG tablet Take 1 tablet by mouth once daily 30 tablet 0   sucralfate (CARAFATE) 1 g tablet Take 1 g by mouth at bedtime. Mix with water     TESTOSTERONE NA Apply 1 application topically daily. Testosterone Topical Cream 2% Compounded by [facility]     VITAMIN K PO Take 90 mcg by mouth daily. MK-7     No current facility-administered medications on file prior to visit.    Allergies  Allergen Reactions   Milk Protein     eczema   Wheat Bran     Migraine headaches   Other Rash    TEGADERM FILM--rash/irritated skin    Physical exam:  Today's Vitals   12/01/20 1004  BP: 120/75  Pulse: 62  Weight: 165 lb (74.8  kg)  Height: 5\' 4"  (1.626 m)   Body mass index is 28.32 kg/m.   Wt Readings from Last 3 Encounters:  12/01/20 165 lb (74.8 kg)  09/15/20 162 lb 12.8 oz (73.8 kg)  03/17/20 159 lb (72.1 kg)     Ht Readings from Last 3 Encounters:  12/01/20 5\' 4"  (1.626 m)  09/15/20 5\' 4"  (1.626 m)  03/17/20 5\' 4"  (1.626 m)      General: The patient is awake, alert and appears not in acute distress. The patient is well groomed. Head: Normocephalic, atraumatic. Neck is supple. Mallampati 2-3,  neck circumference:14 inches . Nasal airflow barely patent.  left tighter than right. Retrognathia is  seen.  Dental status:  Cardiovascular:  Regular rate and cardiac rhythm by pulse,  without distended neck veins. Respiratory: Lungs are clear to auscultation.  Skin:  Without evidence of ankle edema, or rash. Trunk: The patient's posture is erect.   Neurologic exam : The patient is awake and alert, oriented to place and time.   Memory subjective described as intact.  Attention span & concentration ability appears normal.  Speech is fluent,  with nasal speech, dysphonia  Mood and affect are appropriate.   Cranial nerves: no loss of smell or taste reported  Pupils are equal and briskly reactive to light. Funduscopic exam deferred. .  Extraocular movements in vertical and horizontal planes were intact and without nystagmus. No Diplopia. Visual fields by finger perimetry are intact. Hearing was intact to soft voice and finger rubbing.   Facial sensation intact to fine touch.  Facial motor strength is symmetric and tongue and uvula move midline.  Neck ROM : rotation, tilt and flexion extension were normal for age and shoulder shrug was symmetrical.    Motor exam:  Symmetric bulk, tone and ROM.   Normal tone without  cog-wheeling, symmetric grip strength .   Sensory:  Fine touch, pinprick and vibration were tested  and  normal.  Proprioception tested in the upper extremities was normal.   Coordination:  Rapid alternating movements in the fingers/hands were of normal speed.  The Finger-to-nose maneuver was intact without evidence of ataxia, dysmetria or tremor.   Gait and station: Patient could rise unassisted from a seated position, walked without assistive device.  Stance is of normal width/ base .  Toe and heel walk were deferred.  Deep tendon reflexes: in the  upper and lower extremities are symmetric and intact.  Babinski response was deferred.      After spending a total time of  45  minutes ; including face to face and additional time for physical and neurologic examination, review of laboratory studies,  personal review of imaging studies, reports and results of other testing and review of referral information / records as far as provided in visit, I have established the following assessments:  1) Prinzmetal angina may be precipitated by hypoxia, yet has affected this patient at night out of sleep without other physical strains.  Abnormal EKG,  started statins and iso-sorbid dinitrates..microvascular angina or spasms?    1) retrognathia. Could be using a dental device if apnea is found.  2) she is fatigued and not sleepy.  3) she is weaker in her proximal muscles, statin induced?    My Plan is to proceed with:  1)HST should be sufficient. 2)Could be using a dental device if apnea is found.    I would like to thank Harvest Forest, MD and [name], Md [address] [place],  [place] [address] for allowing me to meet with and to take care of this pleasant patient.   In short, Gail Henderson is presenting with fatigue and radical changes in her job life and anxiety  since onset of prinzmetal angina .   I plan to follow up either personally or through our NP within 2-4  month.   Electronically signed by: Melvyn Novas, MD 12/01/2020 10:30 AM  [facility] Sleep Board certified by The [employer] of Sleep Medicine and Diplomate  of the [employer] of Sleep Medicine. Board certified In Neurology through the ABPN, Fellow of the [employer] of Neurology. Medical Director of [employer].

## 2020-12-07 ENCOUNTER — Telehealth: Payer: Self-pay | Admitting: Neurology

## 2020-12-07 NOTE — Telephone Encounter (Signed)
LVM for pt to call me back to schedule sleep study  

## 2020-12-09 ENCOUNTER — Other Ambulatory Visit: Payer: Self-pay | Admitting: Cardiology

## 2020-12-09 ENCOUNTER — Other Ambulatory Visit: Payer: Self-pay

## 2020-12-09 DIAGNOSIS — I209 Angina pectoris, unspecified: Secondary | ICD-10-CM

## 2020-12-09 DIAGNOSIS — I208 Other forms of angina pectoris: Secondary | ICD-10-CM

## 2020-12-09 DIAGNOSIS — E7800 Pure hypercholesterolemia, unspecified: Secondary | ICD-10-CM

## 2020-12-09 MED ORDER — ISOSORBIDE DINITRATE 30 MG PO TABS: 30.0000 mg | ORAL_TABLET | Freq: Two times a day (BID) | ORAL | 0 refills | Status: DC

## 2020-12-13 ENCOUNTER — Telehealth: Payer: Self-pay

## 2020-12-13 NOTE — Telephone Encounter (Signed)
LVM for pt to call me back to schedule sleep study  

## 2020-12-19 ENCOUNTER — Other Ambulatory Visit: Payer: Self-pay | Admitting: Cardiology

## 2020-12-19 DIAGNOSIS — I208 Other forms of angina pectoris: Secondary | ICD-10-CM

## 2020-12-20 ENCOUNTER — Ambulatory Visit (INDEPENDENT_AMBULATORY_CARE_PROVIDER_SITE_OTHER): Payer: 59 | Admitting: Neurology

## 2020-12-20 ENCOUNTER — Other Ambulatory Visit: Payer: Self-pay

## 2020-12-20 DIAGNOSIS — G4733 Obstructive sleep apnea (adult) (pediatric): Secondary | ICD-10-CM

## 2020-12-20 DIAGNOSIS — M2619 Other specified anomalies of jaw-cranial base relationship: Secondary | ICD-10-CM

## 2020-12-20 DIAGNOSIS — I517 Cardiomegaly: Secondary | ICD-10-CM

## 2020-12-20 DIAGNOSIS — E7800 Pure hypercholesterolemia, unspecified: Secondary | ICD-10-CM

## 2020-12-20 DIAGNOSIS — I201 Angina pectoris with documented spasm: Secondary | ICD-10-CM

## 2020-12-20 DIAGNOSIS — I208 Other forms of angina pectoris: Secondary | ICD-10-CM

## 2020-12-20 MED ORDER — NITROGLYCERIN 0.4 MG SL SUBL: SUBLINGUAL_TABLET | SUBLINGUAL | 0 refills | Status: DC

## 2020-12-20 MED ORDER — ROSUVASTATIN CALCIUM 20 MG PO TABS: 20.0000 mg | ORAL_TABLET | Freq: Every day | ORAL | 2 refills | Status: DC

## 2020-12-20 MED ORDER — AMLODIPINE BESYLATE 2.5 MG PO TABS: 2.5000 mg | ORAL_TABLET | Freq: Every day | ORAL | 2 refills | Status: DC

## 2020-12-23 ENCOUNTER — Other Ambulatory Visit: Payer: Self-pay | Admitting: Cardiology

## 2020-12-23 DIAGNOSIS — I208 Other forms of angina pectoris: Secondary | ICD-10-CM

## 2020-12-28 NOTE — Progress Notes (Signed)
[facility] SLEEP TEST REPORT ( by Watch PAT)   STUDY DATE:  12-28-2020 DOB:  12/15/1960    ORDERING CLINICIAN: Melvyn Novas, MD  REFERRING CLINICIAN: [name], MD   CLINICAL INFORMATION/HISTORY: patient with prinzmetal variant angina pectoris.  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  Chief concern according to patient :  patient with Prinzmetal angina, used to take 4-5 times a day nitrate, now not as often having chest pains. She is on Isosorbide mononitrate. She is not working until she has this evaluated. She feels weaker overall. Internal referral  by Dr. Jacinto Halim, for daytime somnolence and snoring. No prior PSG, formerly on adderall and caffeine-   Pt referred by Dr. 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 Cardiology wanting to see if pt could have central sleep apnea. Pt sleeps on side to reduce snoring.    Gail REH has a past medical history of Fatty liver, Gallbladder polyp, Gallstone, GERD, Hypothyroidism, Angina, and Hysterectomy at age 34 , prinzmetal angina, menopausal symptoms, now on HRT.  Left atrial enlargement. Fatigue since menopause. PONV (postoperative nausea and vomiting).    Epworth sleepiness score: 4/24. FSS 60/63 points !!!   BMI: 28.2 kg/m   Neck Circumference: 14"   FINDINGS:   Sleep Summary:   Total Recording Time (hours, min): Total recording time for this feet female patient's home sleep test amounted to 8 hours and 22 minutes of which 7 hours and 17 minutes were calculated total sleep time.  REM sleep was present for 25% of total sleep time.    Percent REM (%):  24.9%                                      Respiratory Indices:   Calculated pAHI (per hour): The calculated AHI was only 6.5/h. REM pAHI:       17.4/h. NREM pAHI:     3/h.                                                                          Positional AHI:   In supine sleep there was an AHI of 2.2 overall improved sleep of 4.3/h and on the left side of 7/h.  This is an unusual distribution.  Snoring level at mean volume 41 dB was present for only 8% of the total sleep time.  Overall a rather mild to moderate degree of snoring, snoring was also dependent on REM sleep and supine sleep.                                                Oxygen Saturation Statistics:      O2 Saturation Range (%):   Oxygenation ranged between a nadir of 79% and a maximum oxygen saturation of 99% with a mean  oxygen saturation of 94%.  Time in hypoxemia was 0.2 minutes only.                                    O2 Saturation (minutes) <89%:   0.2 min.        Pulse Rate Statistics:               Pulse Range:   Varied between 50 to 103 bpm with a mean heart rate of 63 bpm.              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  I would consider treatment still optional unfortunately there was no clear positional change to be recommended.  In supine sleep the patient actually had less apnea than when sleeping on her left side which is unusual.  I do wonder if the chest wall electrode may have not been attached completely or correctly. RECOMMENDATION:If the patient is willing to try a dental device to reduce snoring and apnea I will be happy to refer her, the REM dependent apnea however usually best response to positive airway pressure therapy.  So my first recommendation is if intervention is warranted to use auto CPAP with a setting between 5 and 15 cmH2O to centimeter EPR short ramp setting, heated humidification, and a mask of the patient's choice.  She did indicate that she has a nasal septal deviation.     INTERPRETING PHYSICIAN:   Melvyn Novas, MD   Medical Director of [facility] at [facility].

## 2020-12-30 ENCOUNTER — Encounter: Payer: Self-pay | Admitting: Cardiology

## 2020-12-30 NOTE — Telephone Encounter (Signed)
From pt

## 2021-01-03 ENCOUNTER — Telehealth: Payer: Self-pay | Admitting: *Deleted

## 2021-01-03 DIAGNOSIS — G4733 Obstructive sleep apnea (adult) (pediatric): Secondary | ICD-10-CM

## 2021-01-03 NOTE — Addendum Note (Signed)
Addended by: Melvyn Novas on: 01/03/2021 02:56 PM   Modules accepted: Orders

## 2021-01-03 NOTE — Procedures (Signed)
[facility] SLEEP TEST REPORT ( by Watch PAT)   STUDY DATE:  12-28-2020 DOB:  [birth year]    ORDERING CLINICIAN: Melvyn Novas, MD  REFERRING CLINICIAN: [name], MD   CLINICAL INFORMATION/HISTORY: patient with prinzmetal variant angina pectoris.  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  Chief concern according to patient :  patient with Prinzmetal angina, used to take 4-5 times a day nitrate, now not as often having chest pains. She is on Isosorbide mononitrate. She is not working until she has this evaluated. She feels weaker overall. Internal referral  by Dr. Jacinto Halim, for daytime somnolence and snoring. No prior PSG, formerly on adderall and caffeine-   Pt referred by Dr. 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 Cardiology wanting to see if pt could have central sleep apnea. Pt sleeps on side to reduce snoring.    MIN TUNNELL has a past medical history of Fatty liver, Gallbladder polyp, Gallstone, GERD, Hypothyroidism, Angina, and Hysterectomy at age 26 , prinzmetal angina, menopausal symptoms, now on HRT.  Left atrial enlargement. Fatigue since menopause. PONV (postoperative nausea and vomiting).    Epworth sleepiness score: 4/24. FSS 60/63 points !!!   BMI: 28.2 kg/m   Neck Circumference: 14"   FINDINGS:   Sleep Summary:   Total Recording Time (hours, min): Total recording time for this feet female patient's home sleep test amounted to 8 hours and 22 minutes of which 7 hours and 17 minutes were calculated total sleep time.  REM sleep was present for 25% of total sleep time.    Percent REM (%):  24.9%                                      Respiratory Indices:   Calculated pAHI (per hour): The calculated AHI was only 6.5/h. REM pAHI:       17.4/h. NREM pAHI:     3/h.                                                                          Positional AHI:   In supine sleep there was an AHI of 2.2 overall improved sleep of 4.3/h and on the left side of 7/h.  This is an unusual distribution.  Snoring level at mean volume 41 dB was present for only 8% of the total sleep time.  Overall a rather mild to moderate degree of snoring, snoring was also dependent on REM sleep and supine sleep.                                                Oxygen Saturation Statistics:      O2 Saturation Range (%):   Oxygenation ranged between a nadir of 79% and a maximum oxygen saturation of 99% with a mean oxygen saturation of 94%.  Time in hypoxemia was 0.2 minutes only.                                    O2 Saturation (minutes) <89%:   0.2 min.        Pulse Rate Statistics:               Pulse Range:   Varied between 50 to 103 bpm with a mean heart rate of 63 bpm.              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  I would consider treatment still optional unfortunately there was no clear positional change to be recommended.  In supine sleep the patient actually had less apnea than when sleeping on her left side which is unusual.  I do wonder if the chest wall electrode may have not been attached completely or correctly. RECOMMENDATION:If the patient is willing to try a dental device to reduce snoring and apnea I will be happy to refer her, the REM dependent apnea however usually best response to positive airway pressure therapy.  So my first recommendation is if intervention is warranted to use auto CPAP with a setting between 5 and 15 cmH2O to centimeter EPR short ramp setting, heated humidification, and a mask of the patient's choice.  She did indicate that she has a nasal septal deviation and she has retrognathia- consider a non full face mask with chin strap.     INTERPRETING PHYSICIAN:   Melvyn Novas, MD   Medical Director of [facility] at [facility].

## 2021-01-03 NOTE — Telephone Encounter (Signed)
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  I would consider treatment still optional unfortunately there was no clear positional change to be recommended.  In supine sleep the patient actually had less apnea than when sleeping on her left side which is unusual.  I do wonder if the chest wall electrode may have not been attached completely or correctly. RECOMMENDATION:  If the patient is willing to try a dental device to reduce snoring and apnea I will be happy to refer her, the REM dependent apnea however usually best response to positive airway pressure therapy.    So my first recommendation is if intervention is warranted to use auto CPAP with a setting between 5 and 15 cmH2O to centimeter EPR short ramp setting, heated humidification, and a mask of the patient's choice.  She did indicate that she has a nasal septal deviation and she has retrognathia- consider a non full face mask with chin strap.

## 2021-01-03 NOTE — Addendum Note (Signed)
Addended by: Arther Abbott on: 01/03/2021 04:07 PM   Modules accepted: Orders

## 2021-01-03 NOTE — Telephone Encounter (Signed)
LVM for pt to call about results. °

## 2021-01-03 NOTE — Telephone Encounter (Signed)
Took call from phone staff and spoke w/ pt. Relayed results per Dr. Oliva Bustard note. She would like to have [place] place referral for both dental device and cpap. She wants to compare costs and then will decide which route she wants to go. Aware she may have to pay OOP for dental device if she goes w/ this option before cpap. She will call back once she has more info to let us know how she would like to proceed.  Ok w/ using [employer] [facility]. I sent community message to them that order placed.  Did not make f/u. Pt aware we will hold off until she calls back.

## 2021-02-02 ENCOUNTER — Encounter: Payer: Self-pay | Admitting: Cardiology

## 2021-02-02 NOTE — Telephone Encounter (Signed)
From patient.

## 2021-02-07 ENCOUNTER — Encounter: Payer: Self-pay | Admitting: Cardiology

## 2021-02-07 ENCOUNTER — Other Ambulatory Visit: Payer: Self-pay

## 2021-02-07 DIAGNOSIS — E7800 Pure hypercholesterolemia, unspecified: Secondary | ICD-10-CM

## 2021-02-07 MED ORDER — AMLODIPINE BESYLATE 2.5 MG PO TABS: 2.5000 mg | ORAL_TABLET | Freq: Every day | ORAL | 2 refills | Status: DC

## 2021-02-07 MED ORDER — ROSUVASTATIN CALCIUM 20 MG PO TABS: 20.0000 mg | ORAL_TABLET | Freq: Every day | ORAL | 2 refills | Status: DC

## 2021-02-08 ENCOUNTER — Other Ambulatory Visit: Payer: Self-pay

## 2021-02-08 MED ORDER — AMLODIPINE BESYLATE 10 MG PO TABS: 10.0000 mg | ORAL_TABLET | Freq: Every day | ORAL | 0 refills | Status: DC

## 2021-02-08 MED ORDER — ROSUVASTATIN CALCIUM 10 MG PO TABS: 10.0000 mg | ORAL_TABLET | Freq: Every day | ORAL | 0 refills | Status: DC

## 2021-03-05 ENCOUNTER — Other Ambulatory Visit: Payer: Self-pay | Admitting: Cardiology

## 2021-03-05 DIAGNOSIS — I208 Other forms of angina pectoris: Secondary | ICD-10-CM

## 2021-03-13 ENCOUNTER — Other Ambulatory Visit: Payer: Self-pay | Admitting: Cardiology

## 2021-03-13 DIAGNOSIS — I208 Other forms of angina pectoris: Secondary | ICD-10-CM

## 2021-03-14 ENCOUNTER — Other Ambulatory Visit: Payer: Self-pay | Admitting: Cardiology

## 2021-03-14 DIAGNOSIS — I208 Other forms of angina pectoris: Secondary | ICD-10-CM

## 2021-04-18 ENCOUNTER — Encounter: Payer: Self-pay | Admitting: Cardiology

## 2021-04-18 NOTE — Telephone Encounter (Signed)
From patient.

## 2021-04-25 ENCOUNTER — Encounter: Payer: Self-pay | Admitting: Cardiology

## 2021-04-26 ENCOUNTER — Other Ambulatory Visit: Payer: Self-pay | Admitting: Cardiology

## 2021-04-26 DIAGNOSIS — I208 Other forms of angina pectoris: Secondary | ICD-10-CM

## 2021-06-29 ENCOUNTER — Other Ambulatory Visit: Payer: Self-pay | Admitting: Cardiology

## 2021-06-29 DIAGNOSIS — I208 Other forms of angina pectoris: Secondary | ICD-10-CM

## 2021-07-08 ENCOUNTER — Encounter: Payer: Self-pay | Admitting: Cardiology

## 2021-07-08 NOTE — Telephone Encounter (Signed)
From patient.

## 2021-07-21 ENCOUNTER — Other Ambulatory Visit: Payer: Self-pay | Admitting: Cardiology

## 2021-07-21 DIAGNOSIS — I208 Other forms of angina pectoris: Secondary | ICD-10-CM

## 2021-07-27 ENCOUNTER — Other Ambulatory Visit: Payer: Self-pay | Admitting: Cardiology

## 2021-07-27 DIAGNOSIS — I208 Other forms of angina pectoris: Secondary | ICD-10-CM

## 2021-08-02 ENCOUNTER — Other Ambulatory Visit: Payer: Self-pay | Admitting: Cardiology

## 2021-08-02 DIAGNOSIS — I208 Other forms of angina pectoris: Secondary | ICD-10-CM

## 2021-08-05 ENCOUNTER — Other Ambulatory Visit: Payer: Self-pay | Admitting: Cardiology

## 2021-08-05 ENCOUNTER — Encounter: Payer: Self-pay | Admitting: Cardiology

## 2021-08-05 DIAGNOSIS — I208 Other forms of angina pectoris: Secondary | ICD-10-CM

## 2021-08-08 MED ORDER — NITROGLYCERIN 0.4 MG SL SUBL: 0.4000 mg | SUBLINGUAL_TABLET | SUBLINGUAL | 3 refills | Status: DC | PRN

## 2021-08-08 NOTE — Telephone Encounter (Signed)
ICD-10-CM   1. Stable angina pectoris (HCC)  I20.8 nitroGLYCERIN (NITROSTAT) 0.4 MG SL tablet     Meds ordered this encounter  Medications   nitroGLYCERIN (NITROSTAT) 0.4 MG SL tablet    Sig: Place 1 tablet (0.4 mg total) under the tongue every 5 (five) minutes as needed for chest pain.    Dispense:  90 tablet    Refill:  3

## 2021-08-08 NOTE — Telephone Encounter (Signed)
Please review message. Should pt be taking this many nitroglycerin tablets a day?

## 2021-09-16 ENCOUNTER — Encounter: Payer: Self-pay | Admitting: Cardiology

## 2021-09-16 ENCOUNTER — Ambulatory Visit: Payer: 59 | Admitting: Cardiology

## 2021-09-16 VITALS — BP 115/73 | HR 67 | Temp 97.6°F | Resp 16 | Ht 64.0 in | Wt 162.4 lb

## 2021-09-16 DIAGNOSIS — E7800 Pure hypercholesterolemia, unspecified: Secondary | ICD-10-CM

## 2021-09-16 DIAGNOSIS — I209 Angina pectoris, unspecified: Secondary | ICD-10-CM

## 2021-09-16 MED ORDER — RANOLAZINE ER 1000 MG PO TB12: 1000.0000 mg | ORAL_TABLET | Freq: Two times a day (BID) | ORAL | 2 refills | Status: DC

## 2021-09-16 MED ORDER — NEBIVOLOL HCL 5 MG PO TABS: 5.0000 mg | ORAL_TABLET | Freq: Every evening | ORAL | 2 refills | Status: DC

## 2021-09-16 NOTE — Progress Notes (Unsigned)
Primary Physician/Referring:  Harvest Forest, MD  Patient ID: Gail Henderson, female    DOB: 1960-07-25, 61 y.o.   MRN: [id number]  Chief Complaint  Patient presents with   Chest Pain   Hyperlipidemia   Follow-up    1 year    HPI:    Gail Henderson  is a 61 y.o. 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  Cardiac catheterization on 02/24/2020 which revealed normal coronary arteries but very slow flow which improved with intracoronary nitroglycerin administration.  She was diagnosed with microvascular angina.  Past Medical History:  Diagnosis Date   Fatty liver    showed up on the gallbladder ultrasound; liver enzymes normal   Gallbladder polyp    Gallstone    Hyperlipidemia    Hypothyroidism    PONV (postoperative nausea and vomiting)    Past Surgical History:  Procedure Laterality Date   ABDOMINAL HYSTERECTOMY  2009   BSO   CESAREAN SECTION  1999   CHOLECYSTECTOMY N/A 08/16/2017   Procedure: LAPAROSCOPIC CHOLECYSTECTOMY;  Surgeon: Abigail Miyamoto, MD;  Location: [facility];  Service: General;  Laterality: N/A;   COLONOSCOPY  2013   DIAGNOSTIC LAPAROSCOPY  1992   ovarian cyst right   LEFT HEART CATH AND CORONARY ANGIOGRAPHY N/A 02/24/2020   Procedure: LEFT HEART CATH AND CORONARY ANGIOGRAPHY;  Surgeon: [name], MD;  Location: [facility] INVASIVE CV LAB;  Service: Cardiovascular;  Laterality: N/A;   Family History  Problem Relation Age of Onset   Heart attack Mother    Hypertension Sister    Diabetes Brother     Social History   Tobacco Use   Smoking status: Never   Smokeless tobacco: Never  Substance Use Topics   Alcohol use: Yes    Alcohol/week: 1.0 standard drink of alcohol    Types: 1  Glasses of wine per week    Comment: occasionally   Marital Status: Married  ROS  Review of Systems  Cardiovascular:  Positive for chest pain. Negative for dyspnea on exertion, leg swelling and palpitations.  Gastrointestinal:  Negative for melena.   Objective  Blood pressure 115/73, pulse 67, temperature 97.6 F (36.4 C), temperature source Temporal, resp. rate 16, height 5\' 4"  (1.626 m), weight 162 lb 6.4 oz (73.7 kg), SpO2 96 %.     09/16/2021   11:08 AM 12/01/2020   10:04 AM 09/15/2020   11:03 AM  Vitals with BMI  Height 5\' 4"  5\' 4"  5\' 4"   Weight 162 lbs 6 oz 165 lbs 162 lbs 13 oz  BMI 27.86 28.31 27.93  Systolic 115 120 09/17/2020  Diastolic 73 75 77  Pulse 67 62 70     Physical Exam Neck:     Vascular: No carotid bruit or JVD.  Cardiovascular:     Rate and Rhythm: Normal rate and regular rhythm.     Pulses: Intact distal pulses.     Heart sounds: Normal heart sounds. No murmur heard.    No gallop.  Pulmonary:     Effort: Pulmonary effort is normal.     Breath sounds: Normal breath sounds.  Abdominal:     General: Bowel sounds are normal.     Palpations: Abdomen is soft.  Musculoskeletal:     Right lower  leg: No edema.     Left lower leg: No edema.    Laboratory examination:   External labs:   Cholesterol, total 142.000 m 04/13/2020 HDL 61.000 mg 04/13/2020 LDL 63.000 mg 04/13/2020 Triglycerides 97.000 mg 04/13/2020  Hemoglobin 14.300 g/d 02/24/2020 Platelets 351.000 K/ 02/24/2020  Creatinine, Serum 0.800 mg/ 02/17/2020 Potassium 4.200 mm 02/17/2020 Magnesium N/D ALT (SGPT) 41.000 IU/ 02/17/2020  Medications and allergies   Allergies  Allergen Reactions   Milk Protein     eczema   Wheat Bran     Migraine headaches   Other Rash    TEGADERM FILM--rash/irritated skin     Current Outpatient Medications:    Cholecalciferol (VITAMIN D3) 2000 units TABS, Take 2,000 Units by mouth daily., Disp: , Rfl:    Coenzyme Q10 300 MG CAPS, Take 1 tablet by mouth daily., Disp:  , Rfl:    cyanocobalamin 1000 MCG tablet, Take by mouth daily., Disp: , Rfl:    hydrocortisone valerate cream (WESTCORT) 0.2 %, Apply 1 application topically 2 (two) times daily as needed. For suppurativa dermatitis, Disp: , Rfl: 0   L-METHYLFOLATE PO, Take 5 mg by mouth daily., Disp: , Rfl:    melatonin 5 MG TABS, Take 5 mg by mouth at bedtime., Disp: , Rfl:    Methylcobalamin 5000 MCG TBDP, Take 5,000 mcg by mouth daily., Disp: , Rfl:    Misc Natural Products (PROGESTERONE EX), Apply 1 application topically every evening. Progesterone Cream 5% Compounded by [employer], Disp: , Rfl:    nitroGLYCERIN (NITROSTAT) 0.4 MG SL tablet, Place 1 tablet (0.4 mg total) under the tongue every 5 (five) minutes as needed for chest pain., Disp: 90 tablet, Rfl: 3   NONFORMULARY OR COMPOUNDED ITEM, Apply 1 application topically every evening. BIEST CREAM 70-30 Compounded by [employer], Disp: , Rfl:    pyridOXINE (B-6) 50 MG tablet, Take 50 mg by mouth daily. P5P 50, Disp: , Rfl:    rosuvastatin (CRESTOR) 10 MG tablet, Take 1 tablet (10 mg total) by mouth daily. (Patient taking differently: Take 10 mg by mouth 4 (four) times a week.), Disp: 90 tablet, Rfl: 0   sucralfate (CARAFATE) 1 g tablet, Take 1 g by mouth at bedtime. Mix with water, Disp: , Rfl:    TESTOSTERONE NA, Apply 1 application topically daily. Testosterone Topical Cream 2% Compounded by [employer], Disp: , Rfl:    VITAMIN K PO, Take 90 mcg by mouth daily. MK-7, Disp: , Rfl:    Radiology:   No results found.  Cardiac Studies:   Left Heart Catheterization 02/24/20:  LV: Normal LV systolic function, EF 55 to 60%.  Normal LVEDP, no pressure gradient across the aortic valve. LM: Smooth and normal. LAD: Small, minimal luminal irregularity in the proximal segment, gives origin to small D1 and moderate-sized D2.  No high-grade stenosis evident.  Slow flow was evident in the LAD. CX: Moderate sized vessel.  Again mild luminal  irregularities evident in the midsegment. RCA: Dominant.  Mild luminal irregularities noted in the midsegment.  Again slow flow is evident.   Intracoronary nitroglycerin administration: Marked improvement in flow, brisk flow noted in the LAD, circumflex and right coronary artery post nitroglycerin administration.   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  We will discontinue metoprolol and switch her to amlodipine 2.5 mg daily, continue using nitroglycerin as needed, continue with high intensity statins for now.  Expect good response.  I have reassured the patient.  60 mL contrast  utilized.   EKG:  EKG 09/16/2021: Normal sinus rhythm at rate of 61 bpm, left atrial enlargement, otherwise normal EKG.  No change from 09/15/2020.   Assessment     ICD-10-CM   1. Angina pectoris with normal coronary arteriogram (HCC)  I20.9 EKG 12-Lead    2. Hypercholesteremia  E78.00       No orders of the defined types were placed in this encounter. Orders Placed This Encounter  Procedures   EKG 12-Lead    Recommendations:   LOUVINIA CUMBO is a 61 y.o. 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  Cardiac catheterization on 02/24/2020 which revealed normal coronary arteries but very slow flow which improved with intracoronary nitroglycerin administration.  She was diagnosed with microvascular angina.  This is her annual visit.  She is tolerating all her medications well, she has developed mild gingival hyperplasia from amlodipine and since discontinuing 5 mg amlodipine.  She also states that chest pain symptoms is well controlled with amlodipine vascular previously.  I would like to  try Ranexa [phone] mg twice daily as tolerated for symptoms along with addition of vasodilating beta-blocker, Bystolic 5 mg in the evening.  She has been using sublingual nitroglycerin on a as needed basis.  Unable to tolerate Imdur due to severe headache.  Otherwise, lipids are well controlled, she has very mild hyperlipidemia and is tolerating statins.  I will see her back on annual basis.   [name], MD, [facility] 09/16/2021, 11:21 AM Office: [phone]

## 2021-09-27 ENCOUNTER — Encounter: Payer: Self-pay | Admitting: Cardiology

## 2021-10-05 ENCOUNTER — Telehealth: Payer: Self-pay | Admitting: Cardiology

## 2021-10-05 NOTE — Telephone Encounter (Signed)
Patient had applied for disability, permanent disability, hence discussed her anginal symptoms as patient has given me permission during her office visits.

## 2021-10-25 ENCOUNTER — Encounter: Payer: Self-pay | Admitting: Cardiology

## 2021-10-25 NOTE — Telephone Encounter (Signed)
From patient.

## 2021-10-25 NOTE — Telephone Encounter (Signed)
From Patient

## 2021-12-23 ENCOUNTER — Telehealth: Payer: Self-pay

## 2021-12-23 NOTE — Telephone Encounter (Signed)
Patient called and asked if you filled out her insurance forms?

## 2022-03-06 ENCOUNTER — Other Ambulatory Visit: Payer: Self-pay | Admitting: Cardiology

## 2022-03-06 DIAGNOSIS — I2089 Other forms of angina pectoris: Secondary | ICD-10-CM

## 2022-03-06 DIAGNOSIS — E7800 Pure hypercholesterolemia, unspecified: Secondary | ICD-10-CM

## 2022-05-09 ENCOUNTER — Ambulatory Visit: Payer: Self-pay | Admitting: Neurology

## 2022-05-18 ENCOUNTER — Other Ambulatory Visit: Payer: Self-pay | Admitting: Cardiology

## 2022-05-18 DIAGNOSIS — I2089 Other forms of angina pectoris: Secondary | ICD-10-CM

## 2022-06-11 ENCOUNTER — Other Ambulatory Visit: Payer: Self-pay | Admitting: Cardiology

## 2022-06-11 DIAGNOSIS — E7800 Pure hypercholesterolemia, unspecified: Secondary | ICD-10-CM

## 2022-06-13 ENCOUNTER — Other Ambulatory Visit: Payer: Self-pay | Admitting: Cardiology

## 2022-06-13 ENCOUNTER — Other Ambulatory Visit: Payer: Self-pay

## 2022-06-13 DIAGNOSIS — I2089 Other forms of angina pectoris: Secondary | ICD-10-CM

## 2022-06-13 MED ORDER — ROSUVASTATIN CALCIUM 10 MG PO TABS: 5.0000 mg | ORAL_TABLET | ORAL | 0 refills | Status: DC

## 2022-07-13 ENCOUNTER — Ambulatory Visit (INDEPENDENT_AMBULATORY_CARE_PROVIDER_SITE_OTHER): Payer: 59 | Admitting: Neurology

## 2022-07-13 ENCOUNTER — Encounter: Payer: Self-pay | Admitting: Neurology

## 2022-07-13 VITALS — BP 105/75 | HR 75 | Ht 64.0 in | Wt 152.0 lb

## 2022-07-13 DIAGNOSIS — G4733 Obstructive sleep apnea (adult) (pediatric): Secondary | ICD-10-CM

## 2022-07-13 DIAGNOSIS — M2619 Other specified anomalies of jaw-cranial base relationship: Secondary | ICD-10-CM

## 2022-07-13 DIAGNOSIS — R5382 Chronic fatigue, unspecified: Secondary | ICD-10-CM

## 2022-07-13 NOTE — Patient Instructions (Signed)
ASSESSMENT AND PLAN 62 y.o. year old female here with:    1) Fatigue and non restorative sleep without daytime sleepiness,  and no longer having nocturia or shortness of breath, resolution of chest pain.   2) mild OSA was dx in 2022, and may have disappeared with weight loss, dietary changes and with  Resolution of her chest pain and breathing. She has followed a homeopathic Doctor.  3) could melatonin contribute to her morning grogginess?  Lets reduce the dose .   I will offer a repeat HST to evaluate if Apnea is still present.      I plan to follow up either personally or through our NP within 3-4 months if the HST is positive .   I would like to thank Dr Jacinto Halim, MD and Harvest Forest, Md [address] [place] [address] [place],  [place] [address] for allowing me to meet with and to take care of this pleasant patient.

## 2022-07-13 NOTE — Progress Notes (Signed)
Provider:  Melvyn Novas, MD  Primary Care Physician:  Harvest Forest, MD [address] Raeanne [place]s [place] [address]     Referring Provider: Dr. Jacinto Halim, MD          Chief Complaint according to patient   Patient presents with:      Patient ( second Visit)           HISTORY OF PRESENT ILLNESS:  Gail Henderson is a 62 y.o. female patient who is here for revisit 07/13/2022 for  a CPAP trial.  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 She was here in 2022 and completed a HST. At that time she was trying the dental device and that was going to cost her out of pocket. She was hestiant about doing CPAP and didn't like the fact that the machine was wifi. She isn't sleeping well at night. She is waking up groggy and not refreshed. She states this last through the day. Would like to reassess moving forward with CPAP. "   She has had some changes in her health and sleep. She no longer has heat burn, has been sleeping without Ambien and has been mostly free of chest pain.  She uses a glutathione- inhaler.  She still feels her sleep is not refreshing.    The patient endorsed today's Epworth Sleepiness Scale of 5 points in the fatigue severity scale at 40 points.  She endorsed tinnitus she endorsed 1-3 out of 15 points on the depression score.  I would like to "her home sleep test results from December 2022 when she was referred by Dr. Lynnea Ferrier, MD, her cardiologist.  At that time her fatigue score was 60 out of 63 points much higher than today her sleepiness score was also only 4 out of 25 so there is no major change.  BMI was 28.2 and is now 26.  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  Snoring was only present for 8% of the total sleep time and very mild the mean volume being 41 dB.  She did not have prolonged oxygen desaturations and her heart rate varied  between normal ranges.  Her sleep study was scored by AASM criteria.  She had 25% REM sleep.  If the same study results would be obtained today I would still consider CPAP only a trial as I am not sure that this mild apnea is the cause of fatigue alone.      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  Chief concern according to patient :  patient with Prinzmetal angina, used  to take 4-5 times a day nitrate, now not as often having chest pains. She is on Isosorbide mononitrate. She is not working until she has this evaluated. She d feels weaker overall.  Internal referral  by Dr. Jacinto Halim, for daytime somnolence and snoring. No prior PSG, formerly on adderall and caffeine-  Pt referred by Dr. 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 Cardiology wanting to see if pt could have central sleep apnea. Pt sleeps on side to reduce snoring.    I have the pleasure of seeing Gail  E Henderson today, a right-handed Caucasian female with a possible sleep disorder.   She has a past medical history of Fatty liver, Gallbladder polyp, Gallstone, GERD, Hypothyroidism, Angina, and Hysterectomy at age 21 , prinzmetal angina, menopausal symptoms, now on HRT.  Left  atrial enlargement. Fatigue since menopause. PONV (postoperative nausea and vomiting).   Sleep relevant medical history: Nocturia 1-2 , Sleep walking in childhood, nasal septal deviation. Overbite.  Family medical /sleep history: no other family member on CPAP with OSA.    Social history:  Patient is working as NA and currently on medical leave-  and lives in a household with spouse, 1 dog.  One son in [place], in the [place], grandchildren.  Tobacco use: none .  ETOH use ; 5-7 a week,  causes GERD- Caffeine intake in form of Coffee( /) Soda( /) Tea ( /) or energy drinks. Regular exercise I; since she has  been on medical leave- walking 4 miles.    Sleep habits are as follows: The patient's dinner time is between 6 - 6.30PM. The patient goes to bed at 10 PM and has trouble to fall asleep and continues to sleep for 5-7 hours, wakes for 1-2 bathroom breaks.  In 2022, She woke up a lot with chest pain, but this stopped with drinking a milk shake each night, no nocturia now and lost weight. No longer on Ambien.  The preferred sleep position is supine, on the sides , with the support of a wedge pillows.  Dreams are reportedly frequent/vivid.  Now, 7  AM is the usual rise time. The patient wakes up spontaneously.  She reports not feeling refreshed or restored in AM, with symptoms such as dry mouth, nasal congestion, and residual fatigue.  Naps are taken infrequently.        Review of Systems: Out of a complete 14 system review, the patient complains of only the following symptoms, and all other reviewed systems are negative.:   Post exertional fatigue.  Overbite,  BMI 26 . Had a frozen shoulder-  adhesive capsulitis. Chest pain.   GERD is cured now.    How likely are you to doze in the following situations: 0 = not likely, 1 = slight chance, 2 = moderate chance, 3 = high chance   Sitting and Reading? Watching Television? Sitting inactive in a public place (theater or meeting)? As a passenger in a car for an hour without a break? Lying down in the afternoon when circumstances permit? Sitting and talking to someone? Sitting quietly after lunch without alcohol? In a car, while stopped for a few minutes in traffic?   Total = 5/ 24 points   FSS endorsed at 40/ 63 points.   Social History   Socioeconomic History   Marital status: Married    Spouse name: Regan Rakers   Number of children: 1   Years of education: Not on file   Highest education level: Bachelor's degree (e.g., BA, AB, BS)  Occupational History   Not on file  Tobacco Use   Smoking status: Never   Smokeless tobacco: Never   Vaping Use   Vaping Use: Never used  Substance and Sexual Activity   Alcohol use: Yes    Alcohol/week: 1.0 standard drink of alcohol    Types: 1 Glasses of wine per week    Comment: occasionally   Drug use: Never   Sexual activity: Not on file  Other Topics Concern   Not on file  Social History Narrative   Lives with  husband   Right handed   Caffeine: pt quite drinking caffeine in Jan. 2022   Social Determinants of Health   Financial Resource Strain: Not on file  Food Insecurity: Not on file  Transportation Needs: Not on file  Physical Activity: Not on file  Stress: Not on file  Social Connections: Not on file    Family History  Problem Relation Age of Onset   Heart attack Mother    Hypertension Sister    Diabetes Brother     Past Medical History:  Diagnosis Date   Fatty liver    showed up on the gallbladder ultrasound; liver enzymes normal   Gallbladder polyp    Gallstone    Hyperlipidemia    Hypothyroidism    PONV (postoperative nausea and vomiting)     Past Surgical History:  Procedure Laterality Date   ABDOMINAL HYSTERECTOMY  2009   BSO   CESAREAN SECTION  1999   CHOLECYSTECTOMY N/A 08/16/2017   Procedure: LAPAROSCOPIC CHOLECYSTECTOMY;  Surgeon: Abigail Miyamoto, MD;  Location: [facility];  Service: General;  Laterality: N/A;   COLONOSCOPY  2013   DIAGNOSTIC LAPAROSCOPY  1992   ovarian cyst right   LEFT HEART CATH AND CORONARY ANGIOGRAPHY N/A 02/24/2020   Procedure: LEFT HEART CATH AND CORONARY ANGIOGRAPHY;  Surgeon: [name], MD;  Location: [facility];  Service: Cardiovascular;  Laterality: N/A;     Current Outpatient Medications on File Prior to Visit  Medication Sig Dispense Refill   Cholecalciferol (VITAMIN D3) 2000 units TABS Take 2,000 Units by mouth daily.     Coenzyme Q10 300 MG CAPS Take 1 tablet by mouth daily.     cyanocobalamin 1000 MCG tablet Take by mouth daily.     ESTRADIOL TD Place onto the skin 2 (two) times a week. Pt unsure  of the dose     hydrocortisone valerate cream (WESTCORT) 0.2 % Apply 1 application topically 2 (two) times daily as needed. For suppurativa dermatitis  0   L-METHYLFOLATE PO Take 5 mg by mouth daily.     melatonin 5 MG TABS Take 5 mg by mouth at bedtime.     Methylcobalamin 5000 MCG TBDP Take 5,000 mcg by mouth daily.     nebivolol (BYSTOLIC) 5 MG tablet Take 1 tablet (5 mg total) by mouth every evening. 30 tablet 2   nitroGLYCERIN (NITROSTAT) 0.4 MG SL tablet DISSOLVE ONE TABLET UNDER THE TONGUE EVERY 5 MINUTES AS NEEDED FOR CHEST PAIN.  DO NOT EXCEED A TOTAL OF 3 DOSES IN 15 MINUTES 25 tablet 0   progesterone (PROMETRIUM) 100 MG capsule Take 100 mg by mouth at bedtime.     pyridOXINE (B-6) 50 MG tablet Take 50 mg by mouth daily. P5P 50     rosuvastatin (CRESTOR) 10 MG tablet Take 0.5 tablets (5 mg total) by mouth 4 (four) times a week. 90 tablet 0   sucralfate (CARAFATE) 1 g tablet Take 1 g by mouth at bedtime. Mix with water     TESTOSTERONE NA Apply 1 application topically daily. Testosterone Topical Cream 2% Compounded by [facility]     VITAMIN K PO Take 90 mcg by mouth daily. MK-7     No current facility-administered medications on file prior to visit.    Allergies  Allergen Reactions   Milk Protein     eczema   Wheat     Migraine headaches   Other Rash    TEGADERM FILM--rash/irritated skin     DIAGNOSTIC DATA (LABS,  IMAGING, TESTING) - I reviewed patient records, labs, notes, testing and imaging myself where available.  Lab Results  Component Value Date   WBC 6.9 02/24/2020   HGB 14.3 02/24/2020   HCT 41.5 02/24/2020   MCV 92.4 02/24/2020   PLT 351 02/24/2020      Component Value Date/Time   NA 142 02/17/2020 1400   K 4.2 02/17/2020 1400   CL 102 02/17/2020 1400   CO2 24 02/17/2020 1400   GLUCOSE 79 02/17/2020 1400   BUN 15 02/17/2020 1400   CREATININE 0.80 02/17/2020 1400   CALCIUM 10.3 02/17/2020 1400   PROT 7.6 02/17/2020 1400   ALBUMIN 4.7  02/17/2020 1400   AST 24 02/17/2020 1400   ALT 41 (H) 02/17/2020 1400   ALKPHOS 77 02/17/2020 1400   BILITOT 0.5 02/17/2020 1400   GFRNONAA 80 02/17/2020 1400   GFRAA 93 02/17/2020 1400   Lab Results  Component Value Date   CHOL 142 04/13/2020   HDL 61 04/13/2020   LDLCALC 63 04/13/2020   LDLDIRECT 157 (H) 02/17/2020   TRIG 97 04/13/2020   No results found for: "HGBA1C" No results found for: "VITAMINB12" No results found for: "TSH"  PHYSICAL EXAM:  Today's Vitals   07/13/22 0931  BP: 105/75  Pulse: 75  Weight: 152 lb (68.9 kg)  Height: 5\' 4"  (1.626 m)   Body mass index is 26.09 kg/m.   Wt Readings from Last 3 Encounters:  07/13/22 152 lb (68.9 kg)  09/16/21 162 lb 6.4 oz (73.7 kg)  12/01/20 165 lb (74.8 kg)     Ht Readings from Last 3 Encounters:  07/13/22 5\' 4"  (1.626 m)  09/16/21 5\' 4"  (1.626 m)  12/01/20 5\' 4"  (1.626 m)      General: Normocephalic, atraumatic. Neck is supple. Mallampati 2-3,  neck circumference:13.5 from 14 last visit. Loss of 0.5  inches .  Nasal airflow barely patent.  the left smaller  than right. Retrognathia is  seen.  Dental status:  Cardiovascular:  Regular rate and cardiac rhythm by pulse,  without distended neck veins. Respiratory: Lungs are clear to auscultation.  Skin:  Without evidence of ankle edema. Trunk: The patient's posture is erect.   Neurologic exam : The patient is awake and alert, oriented to place and time.   Memory subjective described as intact.  Attention span & concentration ability appears normal.  Speech is fluent,  with nasal speech, mild dysphonia  Mood and affect are appropriate.   Cranial nerves: no loss of smell or taste reported  Pupils are equal and briskly reactive to light. Funduscopic exam deferred. .  Extraocular movements in vertical and horizontal planes were intact and without nystagmus. No Diplopia. Visual fields by finger perimetry are intact. Hearing was intact to soft voice and finger  rubbing.    Facial motor strength is symmetric and tongue and uvula move midline.  Neck ROM : rotation, tilt and flexion extension were normal for age and shoulder shrug was symmetrical.    Motor exam:  Normal tone without cog-wheeling, symmetric grip strength .   Sensory:   Proprioception tested in the upper extremities was normal.   Coordination: Rapid alternating movements in the fingers/hands were of normal speed.  The Finger-to-nose maneuver was intact without evidence of ataxia, dysmetria or tremor.   Gait and station: Patient could rise unassisted from a seated position, walked without assistive device. Stance is of normal width/ base .  Toe and heel walk were deferred.  Deep tendon reflexes: in  the  upper and lower extremities are symmetric and intact.  Babinski response was deferred.     ASSESSMENT AND PLAN 62 y.o. year old female  here with:    1) Fatigue and non restorative sleep without daytime sleepiness,  and no longer having nocturia or shortness of breath, resolution of chest pain.   2) mild OSA was dx in 2022, and may have disappeared with weight loss, dietary changes and with  Resolution of her chest pain and breathing. She has followed a homeopathic Doctor.  3) could melatonin contribute to her morning grogginess?  Lets reduce the dose .   I will offer a repeat HST to evaluate if Apnea is still present.      I plan to follow up either personally or through our NP within 3-4 months if the HST is positive .   I would like to thank Dr Jacinto Halim, MD and Harvest Forest, Md [address] [place],  [place] [address] for allowing me to meet with and to take care of this pleasant patient.    After spending a total time of  27  minutes face to face and additional time for physical and neurologic examination, review of laboratory studies,  personal review of imaging studies, reports and results of other testing and review of referral information / records  as far as provided in visit,   Electronically signed by: Melvyn Novas, MD 07/13/2022 10:01 AM  [facility] Board certified by The [employer] of Sleep Medicine and Diplomate of the [employer] of Sleep Medicine. Board certified In Neurology through the ABPN, Fellow of the [employer] of Neu[employer].

## 2022-07-18 ENCOUNTER — Other Ambulatory Visit: Payer: Self-pay | Admitting: Cardiology

## 2022-07-18 DIAGNOSIS — I2089 Other forms of angina pectoris: Secondary | ICD-10-CM

## 2022-07-31 ENCOUNTER — Telehealth: Payer: Self-pay | Admitting: Neurology

## 2022-07-31 NOTE — Telephone Encounter (Signed)
HST- [employer] no auth req   Patient is scheduled at [facility] for 08/01/22 at 2 pm.

## 2022-08-01 ENCOUNTER — Telehealth: Payer: Self-pay | Admitting: Neurology

## 2022-08-01 NOTE — Telephone Encounter (Signed)
Pt stated she needs to reschedule picking up CPAP machine. She is requesting a call back.

## 2022-08-01 NOTE — Telephone Encounter (Signed)
I spoke with the patient she has r/s for 08/09/22 at 1:15 pm for her HST.

## 2022-08-09 ENCOUNTER — Ambulatory Visit: Payer: 59 | Admitting: Neurology

## 2022-08-09 DIAGNOSIS — M2619 Other specified anomalies of jaw-cranial base relationship: Secondary | ICD-10-CM

## 2022-08-09 DIAGNOSIS — G4733 Obstructive sleep apnea (adult) (pediatric): Secondary | ICD-10-CM

## 2022-08-09 DIAGNOSIS — R5382 Chronic fatigue, unspecified: Secondary | ICD-10-CM

## 2022-08-16 NOTE — Procedures (Signed)
[facility]  Gail Henderson 62 year old female [birth year]   HOME SLEEP TEST REPORT ( by Watch PAT)   STUDY DATE:  08-11-2022    ORDERING CLINICIAN: Melvyn Novas, MD  REFERRING CLINICIAN: Dr Jacinto Halim, MD    CLINICAL INFORMATION/HISTORY: Referring Provider: Dr. Jacinto Halim, MD   Gail Henderson is a 62 y.o. female patient who is here for revisit 07/13/2022 for  a CPAP trial.  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 She was here in 2022 and completed a HST. At that time she was trying the dental device and that was going to cost her out of pocket. She was hestiant about doing CPAP and didn't like the fact that the machine was wifi. She isn't sleeping well at night. She is waking up groggy and not refreshed. She states this last through the day. Would like to reassess moving forward with CPAP. "    She has had some changes in her health and sleep. She no longer has heat burn, has been sleeping without Ambien and has been mostly free of chest pain.  She uses a glutathione- inhaler.  She still feels her sleep is not refreshing.    The patient endorsed today's Epworth Sleepiness Scale of 5 points in the fatigue severity scale at 40 points.  She endorsed tinnitus she endorsed 1-3 out of 15 points on the depression score.   I would like to  quote :"her home sleep test results from December 2022 when she was referred by Dr. Jeanella Cara MD, her cardiologist.  At that time her fatigue score was 60 out of 63 points much higher than today,  her sleepiness score was also only 4 out of 25 so there is no major change.   BMI was 28.2 and is now 26.   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   Snoring was only present for 8% of the total sleep time and very mild the mean volume being 41 dB.   She did not have prolonged oxygen desaturations and her heart rate varied between normal  ranges.  Her sleep study was scored by AASM criteria.   She had 25% REM sleep.   If the same study results would be obtained today I would still consider CPAP only a trial as I am not sure that this mild apnea is the cause of fatigue alone.      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 Chief concern according to patient :  patient with Prinzmetal angina, used  to take 4-5 times a day nitrate. Internal referral  by Dr. Jacinto Halim, for daytime somnolence and snoring. No prior PSG, formerly on adderall and caffeine-  Pt referred by Dr. 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 Cardiology wanting to see if pt could have central sleep apnea. Pt sleeps on side to reduce snoring.      Epworth sleepiness score: 4/24.   BMI: 28.2 kg/m   Neck Circumference: 14   FINDINGS:   Sleep Summary:   Total Recording Time (hours, min):   7 hours 57 minutes  Total Sleep Time (hours, min): 7 hours 10 minutes  Percent REM (%):    25.8%                                    Respiratory Indices by AASM criteria   Calculated pAHI (per hour): 5.1/h      (CMS criteria  0.6/h)                        REM pAHI:   14.1/h       ( 1.4/h)                                        NREM pAHI:      2.7/h   (0.4/h)                     Positional AHI: The patient slept about 150 minutes in supine position and reached an AHI of 10.1/h, she slept another 153 minutes in prone position with an AHI of only 2.0/h.  This is clearly a very positional dependent apnea    Snoring reached a mean volume of 40 dB that is the threshold of detection.  There were only 9% of total sleep time associated with the stimulation of the vibration recorder.                                                 Oxygen Saturation Statistics:    O2 Saturation Range (%):    Between a nadir at 85 and a maximum of  99 with a mean saturation of 94%                                   O2 Saturation (minutes) <89%:   0 minutes        Pulse Rate Statistics:   Pulse Mean (bpm):    66 bpm             Pulse Range:    Between a minimum of 53 and a maximum of 91 bpm.  Please note that this is only a cardiac rate not a cardiac rhythm             IMPRESSION:  This HST confirms the presence of very,very mild apnea following the AASM criteria. For a medicare patient , we have to apply CMS criteria and under that guideline there is no sleep apnea existing ( AHI was 0.6/h)    RECOMMENDATION: no intervention needed as there is no sleep disordered breathing present.  The patient should avoid the supine sleep position .   There will be no follow up through the sleep clinic.    INTERPRETING PHYSICIAN:   Melvyn Novas, MD

## 2022-08-16 NOTE — Progress Notes (Signed)
HOME SLEEP TEST REPORT ( by Watch PAT)   STUDY DATE:  08-11-2022  No sleep apnea

## 2022-08-17 ENCOUNTER — Telehealth: Payer: Self-pay | Admitting: Neurology

## 2022-08-17 NOTE — Telephone Encounter (Signed)
-----   Message from Hannah Dohmeier sent at 08/16/2022  6:39 PM EDT -----  HST For a medicare patient , we have to apply CMS criteria and under that guideline there is no sleep apnea existing ( AHI was 0.6/h)  It only confirms the presence of very,very mild apnea following the AASM criteria.  RECOMMENDATION: no intervention needed as there is no sleep disordered breathing present.  The patient should avoid the supine sleep position .   There will be no follow up through the sleep clinic provided. The fatigue is not related to a sleep disorder. Marland Kitchen

## 2022-08-17 NOTE — Telephone Encounter (Signed)
Called patient to discuss sleep study results. No answer at this time. LVM for the patient to call back.   

## 2022-08-21 NOTE — Telephone Encounter (Signed)
Pt returned phone call. Instructed by the nurse to relay sleep results. Pt verbalized understand.

## 2022-09-15 ENCOUNTER — Ambulatory Visit: Payer: 59 | Admitting: Cardiology

## 2022-09-18 ENCOUNTER — Other Ambulatory Visit: Payer: Self-pay | Admitting: Cardiology

## 2022-09-18 DIAGNOSIS — E7800 Pure hypercholesterolemia, unspecified: Secondary | ICD-10-CM

## 2022-09-20 ENCOUNTER — Other Ambulatory Visit: Payer: Self-pay | Admitting: Cardiology

## 2022-09-20 DIAGNOSIS — I2089 Other forms of angina pectoris: Secondary | ICD-10-CM

## 2022-10-09 ENCOUNTER — Encounter: Payer: Self-pay | Admitting: Cardiology

## 2022-10-09 ENCOUNTER — Ambulatory Visit: Payer: 59 | Admitting: Cardiology

## 2022-10-09 VITALS — BP 116/81 | HR 72 | Resp 16 | Ht 64.0 in | Wt 154.4 lb

## 2022-10-09 DIAGNOSIS — I209 Angina pectoris, unspecified: Secondary | ICD-10-CM

## 2022-10-09 DIAGNOSIS — E7800 Pure hypercholesterolemia, unspecified: Secondary | ICD-10-CM

## 2022-10-09 NOTE — Progress Notes (Signed)
Primary Physician/Referring:  Har[place], MD  Patient ID: Gail Henderson, female    DOB: 07-27-1960, 62 y.o.   MRN: [id number]  Chief Complaint  Patient presents with   Angina pectoris with normal coronary arteriogram    Follow-up    1 year    HPI:    Gail Henderson  is a 62 y.o. 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  Cardiac catheterization on 02/24/2020 which revealed normal coronary arteries but very slow flow which improved with intracoronary nitroglycerin administration.  She was diagnosed with microvascular angina.  She presents for annual visit, states that due to exertional dyspnea and also chest discomfort suggestive of angina, she was recommended glutathione inhalation by one of her alternative medicine nurse practitioner.  She started this about 2 to 3 months ago and has noticed that she is essentially asymptomatic and now only has occasional episodes of angina for which he takes rare nitroglycerin sublingual.  States that she has resumed most of her activities.  He still feels chest discomfort in situations of stress, anxiety and extremes of work and occasionally at night.  Past Medical History:  Diagnosis Date   Fatty liver    showed up on the gallbladder ultrasound; liver enzymes normal   Gallbladder polyp    Gallstone    Hyperlipidemia    Hypothyroidism    PONV (postoperative nausea and vomiting)    Past Surgical History:  Procedure Laterality Date   ABDOMINAL HYSTERECTOMY  2009   BSO   CESAREAN SECTION  1999   CHOLECYSTECTOMY N/A 08/16/2017   Procedure: LAPAROSCOPIC CHOLECYSTECTOMY;  Surgeon: Abigail Miyamoto, MD;  Location: [facility];  Service: General;  Laterality: N/A;    COLONOSCOPY  2013   DIAGNOSTIC LAPAROSCOPY  1992   ovarian cyst right   LEFT HEART CATH AND CORONARY ANGIOGRAPHY N/A 02/24/2020   Procedure: LEFT HEART CATH AND CORONARY ANGIOGRAPHY;  Surgeon: [name], MD;  Location: [facility];  Service: Cardiovascular;  Laterality: N/A;   Family History  Problem Relation Age of Onset   Heart attack Mother    Hypertension Sister    Diabetes Brother     Social History   Tobacco Use   Smoking status: Never   Smokeless tobacco: Never  Substance Use Topics   Alcohol use: Yes    Alcohol/week: 1.0 standard drink of alcohol    Types: 1 Glasses of wine per week    Comment: occasionally   Marital Status: Married  ROS  Review of Systems  Cardiovascular:  Positive for chest pain. Negative for dyspnea on exertion, leg swelling and palpitations.  Gastrointestinal:  Negative for melena.   Objective  Blood pressure 116/81, pulse 72, resp. rate 16, height 5\' 4"  (1.626 m), weight 154 lb 6.4 oz (70 kg), SpO2 97%.     10/09/2022   11:07 AM 07/13/2022    9:31 AM 09/16/2021   11:08 AM  Vitals with BMI  Height 5\' 4"  5\' 4"  5\' 4"   Weight 154 lbs 6 oz 152 lbs 162 lbs 6 oz  BMI 26.49 26.08 27.86  Systolic 116 105 259  Diastolic 81 75 73  Pulse 72 75 67     Physical Exam Neck:     Vascular: No carotid bruit  or JVD.  Cardiovascular:     Rate and Rhythm: Normal rate and regular rhythm.     Pulses: Intact distal pulses.     Heart sounds: Normal heart sounds. No murmur heard.    No gallop.  Pulmonary:     Effort: Pulmonary effort is normal.     Breath sounds: Normal breath sounds.  Abdominal:     General: Bowel sounds are normal.     Palpations: Abdomen is soft.  Musculoskeletal:     Right lower leg: No edema.     Left lower leg: No edema.    Laboratory examination:   External labs:   Labs 04/05/2022:  Hb 13.9/HCT 43.7, platelets 393.  Serum glucose 95 mg, BUN 13, creatinine 0.77, EGFR 87 mL, potassium 4.4, LFTs normal.  Total  cholesterol 129, triglycerides 107, HDL 51, LDL 58.  Medications and allergies   Allergies  Allergen Reactions   Milk Protein     eczema   Wheat     Migraine headaches   Other Rash    TEGADERM FILM--rash/irritated skin     Current Outpatient Medications:    Cholecalciferol (VITAMIN D3) 2000 units TABS, Take 2,000 Units by mouth daily., Disp: , Rfl:    clobetasol cream (TEMOVATE) 0.05 %, Apply 1 Application topically 2 (two) times daily., Disp: , Rfl:    Coenzyme Q10 300 MG CAPS, Take 1 tablet by mouth daily., Disp: , Rfl:    cyanocobalamin 1000 MCG tablet, Take by mouth daily., Disp: , Rfl:    ESTRADIOL TD, Place onto the skin 2 (two) times a week. Pt unsure of the dose, Disp: , Rfl:    famotidine (PEPCID) 20 MG tablet, Take 20 mg by mouth 2 (two) times daily., Disp: , Rfl:    hydrocortisone valerate cream (WESTCORT) 0.2 %, Apply 1 application topically 2 (two) times daily as needed. For suppurativa dermatitis, Disp: , Rfl: 0   L-METHYLFOLATE PO, Take 5 mg by mouth daily., Disp: , Rfl:    melatonin 5 MG TABS, Take 5 mg by mouth at bedtime., Disp: , Rfl:    Methylcobalamin 5000 MCG TBDP, Take 5,000 mcg by mouth daily., Disp: , Rfl:    Multiple Vitamin (MULTIVITAMIN) tablet, Take 1 tablet by mouth daily., Disp: , Rfl:    NATTOKINASE PO, Take 2,000 mg by mouth., Disp: , Rfl:    nitroGLYCERIN (NITROSTAT) 0.4 MG SL tablet, DISSOLVE ONE TABLET UNDER THE TONGUE EVERY 5 MINUTES AS NEEDED FOR CHEST PAIN.  DO NOT EXCEED A TOTAL OF 3 DOSES IN 15 MINUTES, Disp: 25 tablet, Rfl: 0   Nutritional Supplements (ARGINAID PO), Take by mouth., Disp: , Rfl:    pantoprazole (PROTONIX) 20 MG tablet, Take 20 mg by mouth daily., Disp: , Rfl:    progesterone (PROMETRIUM) 100 MG capsule, Take 100 mg by mouth at bedtime., Disp: , Rfl:    pyridOXINE (B-6) 50 MG tablet, Take 50 mg by mouth daily. P5P 50, Disp: , Rfl:    rosuvastatin (CRESTOR) 10 MG tablet, Take 0.5 tablets (5 mg total) by mouth 4 (four) times a  week., Disp: 90 tablet, Rfl: 0   sucralfate (CARAFATE) 1 g tablet, Take 1 g by mouth at bedtime. Mix with water, Disp: , Rfl:    TESTOSTERONE NA, Apply 1 application topically daily. Testosterone Topical Cream 2% Compounded by [employer], Disp: , Rfl:    VITAMIN K PO, Take 90 mcg by mouth daily. MK-7, Disp: , Rfl:    Radiology:   No results  found.  Cardiac Studies:   Left Heart Catheterization 02/24/20:  LV: Normal LV systolic function, EF 55 to 60%.  Normal LVEDP, no pressure gradient across the aortic valve. LM: Smooth and normal. LAD: Small, minimal luminal irregularity in the proximal segment, gives origin to small D1 and moderate-sized D2.  No high-grade stenosis evident.  Slow flow was evident in the LAD. CX: Moderate sized vessel.  Again mild luminal irregularities evident in the midsegment. RCA: Dominant.  Mild luminal irregularities noted in the midsegment.  Again slow flow is evident.   Intracoronary nitroglycerin administration: Marked improvement in flow, brisk flow noted in the LAD, circumflex and right coronary artery post nitroglycerin administration.   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  We will discontinue metoprolol and switch her to amlodipine 2.5 mg daily, continue using nitroglycerin as needed, continue with high intensity statins for now.  Expect good response.  I have reassured the patient.  60 mL contrast utilized.   EKG:   EKG 10/09/2022: Normal sinus rhythm at rate of 72 bpm, left atrial enlargement, otherwise normal EKG.  Compared to 09/16/2021, no significant change.  Assessment     ICD-10-CM   1. Stable angina pectoris  I20.89     2. Angina pectoris with normal coronary arteriogram (HCC)  I20.9 EKG 12-Lead      No orders of the defined types were placed in this encounter.  Orders Placed This Encounter  Procedures   EKG 12-Lead    Recommendations:   Gail Henderson is a 62  y.o. 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  Cardiac catheterization on 02/24/2020 which revealed normal coronary arteries but very slow flow which improved with intracoronary nitroglycerin administration.  She was diagnosed with microvascular angina.  This is her annual visit.  She is tolerating all her medications well, states that she has been using glutathione inhalation for shortness of breath and exertional chest pain suggestive of angina and also nocturnal angina and since then she has noticed marked improvement in anginal symptoms and dyspnea.  She has not used any sublingual nitroglycerin in the last 1 month.  Overall stable, I discussed with her that as her cardiac status is stable I will see her back on a as needed basis.    [name], MD, [facility] 10/09/2022, 11:41 AM Office: [phone]

## 2022-10-17 ENCOUNTER — Other Ambulatory Visit: Payer: Self-pay | Admitting: Cardiology

## 2022-10-17 DIAGNOSIS — I2089 Other forms of angina pectoris: Secondary | ICD-10-CM

## 2022-10-27 ENCOUNTER — Telehealth: Payer: Self-pay | Admitting: Cardiology

## 2022-10-27 NOTE — Telephone Encounter (Signed)
From front desk

## 2022-10-27 NOTE — Telephone Encounter (Signed)
PT brought in a recert form for her guardian disability that Ottis Stain typivally fills out every 6 months to a year for her. Envelope will be in provider box by EOD

## 2022-10-27 NOTE — Telephone Encounter (Signed)
Form has been given to Dr. Macarthur Critchley states he will complete it over the weekend.

## 2022-11-01 NOTE — Telephone Encounter (Signed)
Completed paperwork has been faxed and sent to medical records

## 2022-11-06 ENCOUNTER — Telehealth: Payer: Self-pay | Admitting: Cardiology

## 2022-11-06 NOTE — Telephone Encounter (Signed)
Spoke with pt and advised disability paperwork faxed on 11/01/2022.  Paperwork has not yet been faxed into system so will forward to medical records for further assistance with receiving a copy.  Call was disconnected before completion.   Attempted to call pt back and message states call cannot be completed at this time.

## 2022-11-06 NOTE — Telephone Encounter (Signed)
See previous encounter.  Patient followed up to confirm disability paperwork was sent in and she requested a copy.

## 2023-01-02 DIAGNOSIS — Z0271 Encounter for disability determination: Secondary | ICD-10-CM

## 2023-03-13 ENCOUNTER — Other Ambulatory Visit: Payer: Self-pay | Admitting: Cardiology

## 2023-03-13 DIAGNOSIS — I2089 Other forms of angina pectoris: Secondary | ICD-10-CM

## 2023-05-15 ENCOUNTER — Encounter: Payer: Self-pay | Admitting: Cardiology

## 2023-05-15 ENCOUNTER — Ambulatory Visit: Attending: Cardiology | Admitting: Cardiology

## 2023-05-15 VITALS — BP 90/64 | HR 74 | Resp 16 | Ht 64.0 in | Wt 145.8 lb

## 2023-05-15 DIAGNOSIS — I209 Angina pectoris, unspecified: Secondary | ICD-10-CM

## 2023-05-15 DIAGNOSIS — E7800 Pure hypercholesterolemia, unspecified: Secondary | ICD-10-CM | POA: Diagnosis not present

## 2023-05-15 DIAGNOSIS — R0609 Other forms of dyspnea: Secondary | ICD-10-CM | POA: Diagnosis not present

## 2023-05-15 NOTE — Patient Instructions (Addendum)
 Medication Instructions:  Your physician recommends that you continue on your current medications as directed. Please refer to the Current Medication list given to you today.  *If you need a refill on your cardiac medications before your next appointment, please call your pharmacy*  Lab Work: none If you have labs (blood work) drawn today and your tests are completely normal, you will receive your results only by: MyChart Message (if you have MyChart) OR A paper copy in the mail If you have any lab test that is abnormal or we need to change your treatment, we will call you to review the results.  Testing/Procedures: Your physician has requested that you have an echocardiogram. Echocardiography is a painless test that uses sound waves to create images of your heart. It provides your doctor with information about the size and shape of your heart and how well your heart's chambers and valves are working. This procedure takes approximately one hour. There are no restrictions for this procedure. Please do NOT wear cologne, perfume, aftershave, or lotions (deodorant is allowed). Please arrive 15 minutes prior to your appointment time.  Please note: We ask at that you not bring children with you during ultrasound (echo/ vascular) testing. Due to room size and safety concerns, children are not allowed in the ultrasound rooms during exams. Our front office staff cannot provide observation of children in our lobby area while testing is being conducted. An adult accompanying a patient to their appointment will only be allowed in the ultrasound room at the discretion of the ultrasound technician under special circumstances. We apologize for any inconvenience.  Your physician has requested that you have an exercise tolerance test. For further information please visit [email or web address] Please also follow instruction sheet, as given.   Follow-Up: At [facility], you and your health needs are our  priority.  As part of our continuing mission to provide you with exceptional heart care, our providers are all part of one team.  This team includes your primary Cardiologist (physician) and Advanced Practice Providers or APPs (Physician Assistants and Nurse Practitioners) who all work together to provide you with the care you need, when you need it.  Your next appointment:   8 week(s)  Provider:   Dr Berry Bristol or APP   We recommend signing up for the patient portal called "MyChart".  Sign up information is provided on this After Visit Summary.  MyChart is used to connect with patients for Virtual Visits (Telemedicine).  Patients are able to view lab/test results, encounter notes, upcoming appointments, etc.  Non-urgent messages can be sent to your provider as well.   To learn more about what you can do with MyChart, go to [email or web address].   Other Instructions D Please arrive 15 minutes prior to your appointment time to allow for registration and insurance purposes.  The test will take approximately 45 minutes to complete.  How to prepare for your Exercise Stress Test: - Do bring a list of your current medications with you. You can take all your medications the day of the test - DO wear comfortable clothes (no dresses or overalls) and walking shoes, tennis shoes preferred (no heels or open toed shoes allowed).     1st Floor: - Lobby - Registration  - Pharmacy  - Lab - Cafe  2nd Floor: - PV Lab - Diagnostic Testing (echo, CT, nuclear med)  3rd Floor: - Vacant  4th Floor: - TCTS (cardiothoracic surgery) - AFib Clinic - Structural Heart Clinic - Vascular Surgery  -  Vascular Ultrasound  5th Floor: - HeartCare Cardiology (general and EP) - Clinical Pharmacy for coumadin, hypertension, lipid, weight-loss medications, and med management appointments    Valet parking services will be available as well.

## 2023-05-15 NOTE — Progress Notes (Signed)
 Cardiology Office Note:  .   Date:  05/15/2023  ID:  Gail Henderson, DOB [birth year], MRN [id number] PCP: Nohemi Batters, MD  [facility] Providers Cardiologist:  Knox Perl, MD   History of Present Illness: .   Gail Henderson is a 63 y.o. 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   Cardiac catheterization on 02/24/2020 which revealed normal coronary arteries but very slow flow which improved with intracoronary nitroglycerin  administration.  She was diagnosed with microvascular angina.  Discussed the use of AI scribe software for clinical note transcription with the patient, who gave verbal consent to proceed.  History of Present Illness The patient, with a history of microvascular disease, presents with increasing chest pain and shortness of breath over the past two months. The chest pain is described as a strain, similar to previous episodes, and is relieved with nitroglycerin .   The shortness of breath is new and occurs with physical activity, to the point where the patient has stopped her afternoon walks. The patient also reports feeling short of breath during other activities such as cooking. The patient has been taking glutathione, which has helped with her angina. The patient has been taking about one nitroglycerin  per day for the past six weeks due to the chest pain. The patient is also on a cholesterol medication, taking Crestor  5 mg four days a week.  Labs   External Labs:  Care Everywhere labs 04/13/2023:  Hb 15.0/HCT 45.2, platelets 473, normal indicis.  Serum glucose 88 mg, BUN 16, creatinine 0.83, EGFR 79 mL, potassium 4.3, LFTs normal.  Total cholesterol 178, triglycerides 129, HDL 57, LDL  98.  A1c 5.2%.  Review of Systems  Cardiovascular:  Negative for chest pain, dyspnea on exertion and leg swelling.   Physical Exam:   VS:  BP 90/64 (BP Location: Left Arm, Patient Position: Sitting, Cuff Size: Normal)   Pulse 74   Resp 16   Ht 5\' 4"  (1.626 m)   Wt 145 lb 12.8 oz (66.1 kg)   SpO2 98%   BMI 25.03 kg/m    Wt Readings from Last 3 Encounters:  05/15/23 145 lb 12.8 oz (66.1 kg)  10/09/22 154 lb 6.4 oz (70 kg)  07/13/22 152 lb (68.9 kg)    Physical Exam Neck:     Vascular: No carotid bruit or JVD.  Cardiovascular:     Rate and Rhythm: Normal rate and regular rhythm.     Pulses: Intact distal pulses.     Heart sounds: Normal heart sounds. No murmur heard.    No gallop.  Pulmonary:     Effort: Pulmonary effort is normal.     Breath sounds: Normal breath sounds.  Abdominal:     General: Bowel sounds are normal.     Palpations: Abdomen is soft.  Musculoskeletal:     Right lower leg: No edema.     Left lower leg: No edema.    Studies Reviewed: Aaron Aas     EKG:    EKG Interpretation Date/Time:  Tuesday May 15 2023 16:19:20 EDT Ventricular Rate:  79 PR Interval:  188 QRS Duration:  90 QT Interval:  376 QTC Calculation: 431 R Axis:   12  Text Interpretation: Repeat EKG 05/15/2023 at 1619 hrs.: Performed  due to low voltage: Normal sinus rhythm at a rate of 79 bpm, normal axis, poor R wave progression, cannot exclude anteroseptal infarct old, consider pulmonary disease pattern.  Compared to 05/15/2023, voltage slightly increased in the chest leads. Reconfirmed by Kaneesha Constantino, Jagadeesh ([id number]) on 05/15/2023 4:33:48 PM    Medications and allergies    Allergies  Allergen Reactions   Milk Protein     eczema   Wheat     Migraine headaches   Other Rash    TEGADERM FILM--rash/irritated skin     Current Outpatient Medications:    cholecalciferol (VITAMIN D3) 25 MCG (1000 UNIT) tablet, Take 1,000 Units by mouth daily., Disp: , Rfl:    clobetasol cream (TEMOVATE) 0.05  %, Apply 1 Application topically 2 (two) times daily., Disp: , Rfl:    cyanocobalamin 1000 MCG tablet, Take by mouth daily., Disp: , Rfl:    ESTRADIOL TD, Place onto the skin 2 (two) times a week. Pt unsure of the dose, Disp: , Rfl:    famotidine (PEPCID) 20 MG tablet, Take 20 mg by mouth 2 (two) times daily., Disp: , Rfl:    L-METHYLFOLATE PO, Take 5 mg by mouth daily., Disp: , Rfl:    melatonin 5 MG TABS, Take 5 mg by mouth at bedtime., Disp: , Rfl:    Methylcobalamin 5000 MCG TBDP, Take 5,000 mcg by mouth daily., Disp: , Rfl:    Multiple Vitamin (MULTIVITAMIN) tablet, Take 1 tablet by mouth daily., Disp: , Rfl:    NATTOKINASE PO, Take 2,000 mg by mouth., Disp: , Rfl:    nitroGLYCERIN  (NITROSTAT ) 0.4 MG SL tablet, DISSOLVE ONE TABLET UNDER THE TONGUE EVERY 5 MINUTES AS NEEDED FOR CHEST PAIN.  DO NOT EXCEED A TOTAL OF 3 DOSES IN 15 MINUTES, Disp: 25 tablet, Rfl: 7   Nutritional Supplements (ARGINAID PO), Take by mouth., Disp: , Rfl:    progesterone (PROMETRIUM) 100 MG capsule, Take 100 mg by mouth at bedtime., Disp: , Rfl:    pyridOXINE (B-6) 50 MG tablet, Take 50 mg by mouth daily. P5P 50, Disp: , Rfl:    rosuvastatin  (CRESTOR ) 10 MG tablet, Take 0.5 tablets (5 mg total) by mouth 4 (four) times a week., Disp: 90 tablet, Rfl: 0   sucralfate (CARAFATE) 1 g tablet, Take 1 g by mouth at bedtime. Mix with water, Disp: , Rfl:    TESTOSTERONE NA, Apply 1 application topically daily. Testosterone Topical Cream 2% Compounded by [facility], Disp: , Rfl:    TIRZEPATIDE [place], Inject 17 mg into the skin., Disp: , Rfl:    VITAMIN K PO, Take 90 mcg by mouth daily. MK-7, Disp: , Rfl:    No orders of the defined types were placed in this encounter.    Medications Discontinued During This Encounter  Medication Reason   pantoprazole (PROTONIX) 20 MG tablet Patient Preference   Coenzyme Q10 300 MG CAPS Patient Preference   hydrocortisone valerate cream (WESTCORT) 0.2 % Patient Preference      ASSESSMENT AND PLAN: .      ICD-10-CM   1. Angina pectoris with normal coronary arteriogram (HCC)  I20.9 EKG 12-Lead    EKG 12-Lead    Cardiac Stress Test: Informed Consent Details: Physician/Practitioner Attestation; Transcribe to consent form and obtain patient signature    2. Hypercholesteremia  E78.00     3. Dyspnea on exertion  R06.09 Cardiac Stress Test: Informed Consent Details: Physician/Practitioner Attestation; Transcribe to consent form and obtain patient signature    EXERCISE TOLERANCE TEST (ETT)  ECHOCARDIOGRAM COMPLETE      Assessment and Plan Assessment & Plan Microvascular angina   She experiences intermittent chest pain described as a strain over the last few months, managed with nitroglycerin  taken approximately once daily. EKG shows no significant changes from previous readings, with low voltage complexes likely lead placement however repeat EKG shows slightly improved voltage but still low it is in the lateral chest leads.  Previous heart catheterization revealed no coronary artery disease. Current management includes glutathione, with consideration for additional beetroot supplementation to increase nitrate production naturally.  Schedule a treadmill exercise stress test to evaluate exercise tolerance and EKG changes. Order an echocardiogram to assess for diastolic dysfunction and structural abnormalities.  Her blood pressure has been very soft to include any antianginal agents.  Previously she had tried Ranexa  she is now off of it.  Shortness of breath   She reports worsening shortness of breath on exertion over the last two months, limiting activities like walking and cooking. EKG shows low voltage complexes, suspected to be machine-related/lead placement. Differential diagnosis includes cardiac infiltrative diseases and non-cardiac causes like active airway disease.  A previous echocardiogram in 2022 was normal, and tidal volume checked by Dr. Gael Jolly was normal.   Schedule a treadmill exercise stress test to evaluate exercise tolerance and EKG changes. Order an echocardiogram to assess for diastolic dysfunction and structural abnormalities. If the cardiac workup is negative, consider pulmonary and non-cardiac evaluations.  She will be seen by one of my APPS after the tests to close the loop, otherwise see me PRN.  Continue low-dose statins for endothelial dysfunction.   Signed,  Knox Perl, MD, [facility] 05/15/2023, 4:48 PM [facility] [address] [place], [place] [address] Phone: [phone]. Fax:  [phone]

## 2023-06-07 ENCOUNTER — Other Ambulatory Visit: Payer: Self-pay

## 2023-06-07 MED ORDER — ROSUVASTATIN CALCIUM 10 MG PO TABS
5.0000 mg | ORAL_TABLET | ORAL | 3 refills | Status: AC
Start: 2023-06-07 — End: ?

## 2023-06-15 ENCOUNTER — Telehealth (HOSPITAL_COMMUNITY): Payer: Self-pay | Admitting: *Deleted

## 2023-06-15 NOTE — Telephone Encounter (Signed)
 LDM on home voice mail regarding ETT on 06/21/23 at 1:45.

## 2023-06-21 ENCOUNTER — Ambulatory Visit: Admitting: Cardiology

## 2023-06-21 ENCOUNTER — Ambulatory Visit (HOSPITAL_COMMUNITY)
Admission: RE | Admit: 2023-06-21 | Discharge: 2023-06-21 | Disposition: A | Discharge status: Home / Self Care | Source: Ambulatory Visit | Attending: Cardiology | Admitting: Cardiology

## 2023-06-21 ENCOUNTER — Ambulatory Visit: Payer: Self-pay | Admitting: Cardiology

## 2023-06-21 DIAGNOSIS — R0609 Other forms of dyspnea: Secondary | ICD-10-CM | POA: Diagnosis present

## 2023-06-21 LAB — ECHOCARDIOGRAM COMPLETE
Area-P 1/2: 3.43 cm2
S' Lateral: 2.43 cm

## 2023-06-21 LAB — EXERCISE TOLERANCE TEST
Angina Index: 0
Duke Treadmill Score: 8
Estimated workload: 10.1
Exercise duration (min): 8 min
Rest HR: 74 {beats}/min
ST Depression (mm): 0 mm

## 2023-06-21 NOTE — Progress Notes (Signed)
 Normal echo.

## 2023-06-21 NOTE — Progress Notes (Signed)
    No ST deviation was noted.   Negative, adequate stress test.   Excellent exercise capacity. 10 METS.

## 2023-07-05 ENCOUNTER — Telehealth: Payer: Self-pay | Admitting: Cardiology

## 2023-07-05 NOTE — Telephone Encounter (Signed)
 Paper Work Dropped Off: Guardian Disability   Date: 0612/25  Location of paper:  Dr Berry Bristol mailbox

## 2023-07-06 NOTE — Telephone Encounter (Signed)
 I spoke with patient.  She reports Dr Berry Bristol has completed this form in the past for her.  Once completed she would like copy and also have form faxed to number listed on paperwork

## 2023-07-06 NOTE — Telephone Encounter (Signed)
 Completed paperwork faxed.  Patient notified.  She would like paperwork mailed to her.  Completed paperwork mailed to patient

## 2023-07-11 ENCOUNTER — Ambulatory Visit: Attending: Cardiology | Admitting: Cardiology

## 2023-07-11 ENCOUNTER — Encounter: Payer: Self-pay | Admitting: Cardiology

## 2023-07-11 VITALS — BP 132/74 | HR 79 | Ht 64.0 in | Wt 146.0 lb

## 2023-07-11 DIAGNOSIS — I209 Angina pectoris, unspecified: Secondary | ICD-10-CM

## 2023-07-11 DIAGNOSIS — R0609 Other forms of dyspnea: Secondary | ICD-10-CM | POA: Diagnosis not present

## 2023-07-11 MED ORDER — AMLODIPINE BESYLATE 5 MG PO TABS: 5.0000 mg | ORAL_TABLET | ORAL | 1 refills | Status: AC

## 2023-07-11 NOTE — Patient Instructions (Signed)

## 2023-07-11 NOTE — Progress Notes (Signed)
  Cardiology Office Note:  .   Date:  07/11/2023  ID:  Gail Henderson, DOB 1960/04/12, MRN [id number] PCP: Nohemi Batters, MD  [facility] Providers Cardiologist:  Knox Perl, MD { Click to update primary MD,subspecialty MD or APP then REFRESH:1}  History of Present Illness: .   Gail Henderson is a 63 y.o.  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   Cardiac catheterization on 02/24/2020 which revealed normal coronary arteries but very slow flow which improved with intracoronary nitroglycerin  administration.  She was diagnosed with microvascular angina.  Discussed the use of AI scribe software for clinical note transcription with the patient, who gave verbal consent to proceed.  History of Present Illness    Labs   External Labs:  PCP labs reviewed from 04/13/2023 including CBC, CMP and lipids normal.  ROS  ***ROS  Physical Exam:   VS:  BP 132/74 (BP Location: Right Arm, Patient Position: Sitting, Cuff Size: Normal)   Pulse 79   Ht 5' 4 (1.626 m)   Wt 146 lb (66.2 kg)   SpO2 99%   BMI 25.06 kg/m    Wt Readings from Last 3 Encounters:  07/11/23 146 lb (66.2 kg)  05/15/23 145 lb 12.8 oz (66.1 kg)  10/09/22 154 lb 6.4 oz (70 kg)    ***Physical Exam Studies Reviewed: Aaron Aas    EXERCISE TOLERANCE TEST (ETT) 06/21/2023   Narrative   No ST deviation was noted.   Negative, adequate stress test.   Excellent exercise capacity, 10 METS.    ECHOCARDIOGRAM COMPLETE 06/21/2023 1. Left ventricular ejection fraction, by estimation, is 60 to 65%. The left ventricle has normal function. The left ventricle has no regional wall motion abnormalities. Left ventricular diastolic parameters were normal. The  average left ventricular global longitudinal strain is -20.9 %. The global longitudinal strain is normal. 2. Right ventricular systolic function is normal. The right ventricular size is normal. 3. The mitral valve is normal in structure. No evidence of mitral valve regurgitation. No evidence of mitral stenosis. 4. The aortic valve is tricuspid. Aortic valve regurgitation is not visualized. No aortic stenosis is present. 5. The inferior vena cava is normal in size with greater than 50% respiratory variability, suggesting right atrial pressure of 3 mmHg.  EKG:       ***  Medications ordered    No orders of the defined types were placed in this encounter.    ASSESSMENT AND PLAN: .      ICD-10-CM   1. Dyspnea on exertion  R06.09     2. Angina pectoris with normal coronary arteriogram [facility])  I20.9       Assessment and Plan Assessment & Plan      Signed,  Knox Perl, MD, [facility] 07/11/2023, 11:43 AM [facility] [address] [place], [place] [address] Phone: [phone]. Fax:  [phone]
